# Patient Record
Sex: Male | Born: 1970 | Race: Black or African American | Hispanic: No | Marital: Single | Smoking: Never smoker
Health system: Southern US, Community
[De-identification: ages and names within clinical notes are randomized; demographics above are authoritative.]

## PROBLEM LIST (undated history)

## (undated) DIAGNOSIS — I10 Essential (primary) hypertension: Secondary | ICD-10-CM

## (undated) DIAGNOSIS — E119 Type 2 diabetes mellitus without complications: Secondary | ICD-10-CM

## (undated) DIAGNOSIS — G4730 Sleep apnea, unspecified: Secondary | ICD-10-CM

## (undated) HISTORY — PX: COLONOSCOPY: SHX174

## (undated) HISTORY — DX: Sleep apnea, unspecified: G47.30

---

## 2002-03-17 ENCOUNTER — Emergency Department (HOSPITAL_COMMUNITY): Admission: EM | Admit: 2002-03-17 | Discharge: 2002-03-17 | Payer: Self-pay | Admitting: Emergency Medicine

## 2002-04-02 ENCOUNTER — Emergency Department (HOSPITAL_COMMUNITY): Admission: EM | Admit: 2002-04-02 | Discharge: 2002-04-02 | Payer: Self-pay | Admitting: Emergency Medicine

## 2017-09-26 ENCOUNTER — Encounter (HOSPITAL_COMMUNITY): Payer: Self-pay | Admitting: Emergency Medicine

## 2017-09-26 ENCOUNTER — Inpatient Hospital Stay (HOSPITAL_COMMUNITY)
Admission: EM | Admit: 2017-09-26 | Discharge: 2017-09-29 | DRG: 638 | Disposition: A | Discharge status: Home / Self Care | Payer: Managed Care, Other (non HMO) | Attending: Internal Medicine | Admitting: Internal Medicine

## 2017-09-26 ENCOUNTER — Other Ambulatory Visit: Payer: Self-pay

## 2017-09-26 DIAGNOSIS — E860 Dehydration: Secondary | ICD-10-CM | POA: Diagnosis present

## 2017-09-26 DIAGNOSIS — E1110 Type 2 diabetes mellitus with ketoacidosis without coma: Secondary | ICD-10-CM | POA: Diagnosis not present

## 2017-09-26 DIAGNOSIS — I10 Essential (primary) hypertension: Secondary | ICD-10-CM | POA: Diagnosis present

## 2017-09-26 DIAGNOSIS — E119 Type 2 diabetes mellitus without complications: Secondary | ICD-10-CM

## 2017-09-26 DIAGNOSIS — Z6841 Body Mass Index (BMI) 40.0 and over, adult: Secondary | ICD-10-CM

## 2017-09-26 DIAGNOSIS — Z7984 Long term (current) use of oral hypoglycemic drugs: Secondary | ICD-10-CM

## 2017-09-26 DIAGNOSIS — E138 Other specified diabetes mellitus with unspecified complications: Secondary | ICD-10-CM

## 2017-09-26 DIAGNOSIS — E875 Hyperkalemia: Secondary | ICD-10-CM | POA: Diagnosis not present

## 2017-09-26 DIAGNOSIS — IMO0001 Reserved for inherently not codable concepts without codable children: Secondary | ICD-10-CM

## 2017-09-26 DIAGNOSIS — E878 Other disorders of electrolyte and fluid balance, not elsewhere classified: Secondary | ICD-10-CM | POA: Diagnosis present

## 2017-09-26 DIAGNOSIS — E871 Hypo-osmolality and hyponatremia: Secondary | ICD-10-CM | POA: Diagnosis present

## 2017-09-26 DIAGNOSIS — R739 Hyperglycemia, unspecified: Secondary | ICD-10-CM | POA: Diagnosis not present

## 2017-09-26 DIAGNOSIS — E876 Hypokalemia: Secondary | ICD-10-CM | POA: Diagnosis not present

## 2017-09-26 DIAGNOSIS — Z794 Long term (current) use of insulin: Secondary | ICD-10-CM

## 2017-09-26 HISTORY — DX: Type 2 diabetes mellitus without complications: E11.9

## 2017-09-26 LAB — I-STAT CHEM 8, ED
BUN: 30 mg/dL — AB (ref 6–20)
CHLORIDE: 92 mmol/L — AB (ref 101–111)
CREATININE: 1.1 mg/dL (ref 0.61–1.24)
Calcium, Ion: 1.09 mmol/L — ABNORMAL LOW (ref 1.15–1.40)
HCT: 50 % (ref 39.0–52.0)
Hemoglobin: 17 g/dL (ref 13.0–17.0)
POTASSIUM: 6 mmol/L — AB (ref 3.5–5.1)
Sodium: 124 mmol/L — ABNORMAL LOW (ref 135–145)
TCO2: 18 mmol/L — ABNORMAL LOW (ref 22–32)

## 2017-09-26 LAB — GLUCOSE, CAPILLARY
GLUCOSE-CAPILLARY: 319 mg/dL — AB (ref 65–99)
Glucose-Capillary: 459 mg/dL — ABNORMAL HIGH (ref 65–99)
Glucose-Capillary: 391 mg/dL — ABNORMAL HIGH (ref 65–99)
Glucose-Capillary: 527 mg/dL (ref 65–99)
Glucose-Capillary: 469 mg/dL — ABNORMAL HIGH (ref 65–99)
Glucose-Capillary: 466 mg/dL — ABNORMAL HIGH (ref 65–99)
Glucose-Capillary: 387 mg/dL — ABNORMAL HIGH (ref 65–99)
Glucose-Capillary: 364 mg/dL — ABNORMAL HIGH (ref 65–99)

## 2017-09-26 LAB — BLOOD GAS, VENOUS
Acid-base deficit: 10 mmol/L — ABNORMAL HIGH (ref 0.0–2.0)
BICARBONATE: 14.4 mmol/L — AB (ref 20.0–28.0)
O2 SAT: 94.1 %
PATIENT TEMPERATURE: 98.6
PCO2 VEN: 29 mmHg — AB (ref 44.0–60.0)
PO2 VEN: 78.6 mmHg — AB (ref 32.0–45.0)
pH, Ven: 7.318 (ref 7.250–7.430)

## 2017-09-26 LAB — CBC WITH DIFFERENTIAL/PLATELET
Basophils Absolute: 0 10*3/uL (ref 0.0–0.1)
Basophils Relative: 0 %
EOS ABS: 0 10*3/uL (ref 0.0–0.7)
EOS PCT: 0 %
HCT: 43.6 % (ref 39.0–52.0)
HEMOGLOBIN: 15.2 g/dL (ref 13.0–17.0)
LYMPHS PCT: 18 %
Lymphs Abs: 1.2 10*3/uL (ref 0.7–4.0)
MCH: 29.3 pg (ref 26.0–34.0)
MCHC: 34.9 g/dL (ref 30.0–36.0)
MCV: 84.2 fL (ref 78.0–100.0)
MONOS PCT: 12 %
Monocytes Absolute: 0.8 10*3/uL (ref 0.1–1.0)
Neutro Abs: 4.7 10*3/uL (ref 1.7–7.7)
Neutrophils Relative %: 70 %
PLATELETS: 352 10*3/uL (ref 150–400)
RBC: 5.18 MIL/uL (ref 4.22–5.81)
RDW: 13.5 % (ref 11.5–15.5)
WBC: 6.8 10*3/uL (ref 4.0–10.5)

## 2017-09-26 LAB — URINALYSIS, ROUTINE W REFLEX MICROSCOPIC
Bacteria, UA: NONE SEEN
Bilirubin Urine: NEGATIVE
Glucose, UA: >=500 mg/dL — AB
KETONES UR: 20 mg/dL — AB
Leukocytes, UA: NEGATIVE
Nitrite: NEGATIVE
PROTEIN: NEGATIVE mg/dL
Specific Gravity, Urine: 1.025 (ref 1.005–1.030)
pH: 5 (ref 5.0–8.0)

## 2017-09-26 LAB — BASIC METABOLIC PANEL
Anion gap: 12 (ref 5–15)
Anion gap: 11 (ref 5–15)
BUN: 19 mg/dL (ref 6–20)
BUN: 16 mg/dL (ref 6–20)
CALCIUM: 9.3 mg/dL (ref 8.9–10.3)
CO2: 20 mmol/L — ABNORMAL LOW (ref 22–32)
CO2: 21 mmol/L — ABNORMAL LOW (ref 22–32)
CREATININE: 1.22 mg/dL (ref 0.61–1.24)
Calcium: 9.5 mg/dL (ref 8.9–10.3)
Chloride: 99 mmol/L — ABNORMAL LOW (ref 101–111)
Chloride: 102 mmol/L (ref 101–111)
Creatinine, Ser: 1.1 mg/dL (ref 0.61–1.24)
GFR calc Af Amer: >60 mL/min (ref >60)
GFR calc Af Amer: >60 mL/min (ref >60)
GFR calc non Af Amer: >60 mL/min (ref >60)
GFR calc non Af Amer: >60 mL/min (ref >60)
GLUCOSE: 421 mg/dL — AB (ref 65–99)
Glucose, Bld: 406 mg/dL — ABNORMAL HIGH (ref 65–99)
Potassium: 3.9 mmol/L (ref 3.5–5.1)
Potassium: 3.7 mmol/L (ref 3.5–5.1)
SODIUM: 131 mmol/L — AB (ref 135–145)
Sodium: 134 mmol/L — ABNORMAL LOW (ref 135–145)

## 2017-09-26 LAB — CBG MONITORING, ED
GLUCOSE-CAPILLARY: 464 mg/dL — AB (ref 65–99)
Glucose-Capillary: >600 mg/dL (ref 65–99)
Glucose-Capillary: >600 mg/dL (ref 65–99)
Glucose-Capillary: >600 mg/dL (ref 65–99)

## 2017-09-26 LAB — MRSA PCR SCREENING: MRSA BY PCR: NEGATIVE

## 2017-09-26 MED ORDER — SODIUM CHLORIDE 0.9 % IV BOLUS (SEPSIS)
1000.0000 mL | Freq: Once | INTRAVENOUS | Status: AC
  Administered 2017-09-26: 1000 mL via INTRAVENOUS

## 2017-09-26 MED ORDER — SODIUM CHLORIDE 0.9 % IV SOLN
INTRAVENOUS | Status: DC
  Administered 2017-09-26: 11:00:00 via INTRAVENOUS

## 2017-09-26 MED ORDER — SODIUM CHLORIDE 0.9 % IV SOLN
INTRAVENOUS | Status: DC
  Administered 2017-09-26: 5.4 [IU]/h via INTRAVENOUS
  Administered 2017-09-26: 23.4 [IU]/h via INTRAVENOUS
  Filled 2017-09-26 (×4): qty 1

## 2017-09-26 MED ORDER — SODIUM CHLORIDE 0.9 % IV SOLN
INTRAVENOUS | Status: AC
  Administered 2017-09-26: 17:00:00 via INTRAVENOUS

## 2017-09-26 NOTE — Plan of Care (Signed)
  Respiratory: Ability to maintain adequate ventilation will improve 09/26/2017 1857 - Progressing by Lani Daltonarpenter, Cecil Bixby L, RN   Nutritional: Maintenance of adequate nutrition will improve 09/26/2017 1857 - Progressing by Dallin Daltonarpenter, Rochell Mabie L, RN   Urinary Elimination: Ability to achieve and maintain adequate renal perfusion and functioning will improve 09/26/2017 1857 - Progressing by Ramey Daltonarpenter, Tahari Clabaugh L, RN

## 2017-09-26 NOTE — ED Provider Notes (Signed)
[facility]-EMERGENCY DEPT Provider Note   CSN: [id number] Arrival date & time: 09/26/17  1023     History   Chief Complaint Chief Complaint  Patient presents with  . Hyperglycemia    HPI Chad Vega is a 47 y.o. male.  HPI Patient presents to the emergency room for evaluation of hyperglycemia.  Patient has a history of diabetes.  He takes metformin.  Patient does not have a glucose meter at home blood sugars.  He has noticed over the last few weeks that he has been fatigued and has had increased thirst and urination.  He has been on the same dose of metformin for a while.  He went to see his primary care doctor today and at the office his blood sugar was reading as high and could not be measured.  Patient was sent to the emergency room for further evaluation.  He denies any trouble with fevers, cough, vomiting, or diarrhea.  No significant changes in his diet.  No recent illnesses. Past Medical History:  Diagnosis Date  . Diabetes mellitus without complication (HCC)     There are no active problems to display for this patient.   History reviewed. No pertinent surgical history.     Home Medications    Prior to Admission medications   Medication Sig Start Date End Date Taking? Authorizing Provider  Cholecalciferol (VITAMIN D3) 5000 units CAPS Take 5,000 capsules by mouth daily. 09/15/17  Yes [provider]  esomeprazole (NEXIUM) 40 MG capsule Take 40 mg by mouth daily. 09/15/17  Yes [provider]  metFORMIN (GLUCOPHAGE) 500 MG tablet Take 500 mg by mouth 2 (two) times daily. 09/25/17  Yes [provider]  telmisartan-hydrochlorothiazide (MICARDIS HCT) 80-25 MG tablet Take 1 tablet by mouth daily. 09/10/17  Yes [provider]    Family History No family history on file.  Social History Social History   Tobacco Use  . Smoking status: Never Smoker  . Smokeless tobacco: Never Used  Substance Use Topics  .  Alcohol use: No    Frequency: Never  . Drug use: Not on file     Allergies   Patient has no known allergies.   Review of Systems Review of Systems  All other systems reviewed and are negative.    Physical Exam Updated Vital Signs BP 129/86 (BP Location: Right Arm)   Pulse 97   Temp 99.1 F (37.3 C) (Oral)   Resp 20   SpO2 98%   Physical Exam  Constitutional: He appears well-developed and well-nourished. No distress.  Overweight  HENT:  Head: Normocephalic and atraumatic.  Right Ear: External ear normal.  Left Ear: External ear normal.  Eyes: Conjunctivae are normal. Right eye exhibits no discharge. Left eye exhibits no discharge. No scleral icterus.  Neck: Neck supple. No tracheal deviation present.  Cardiovascular: Normal rate, regular rhythm and intact distal pulses.  Pulmonary/Chest: Effort normal and breath sounds normal. No stridor. No respiratory distress. He has no wheezes. He has no rales.  Abdominal: Soft. Bowel sounds are normal. He exhibits no distension. There is no tenderness. There is no rebound and no guarding.  Musculoskeletal: He exhibits no edema or tenderness.  Neurological: He is alert. He has normal strength. No cranial nerve deficit (no facial droop, extraocular movements intact, no slurred speech) or sensory deficit. He exhibits normal muscle tone. He displays no seizure activity. Coordination normal.  Skin: Skin is warm and dry. No rash noted.  Psychiatric: He has a normal  mood and affect.  Nursing note and vitals reviewed.    ED Treatments / Results  Labs (all labs ordered are listed, but only abnormal results are displayed) Labs Reviewed  BLOOD GAS, VENOUS - Abnormal; Notable for the following components:      Result Value   pCO2, Ven 29.0 (*)    pO2, Ven 78.6 (*)    Bicarbonate 14.4 (*)    Acid-base deficit 10.0 (*)    All other components within normal limits  URINALYSIS, ROUTINE W REFLEX MICROSCOPIC - Abnormal; Notable for the  following components:   Color, Urine COLORLESS (*)    Glucose, UA >=500 (*)    Hgb urine dipstick SMALL (*)    Ketones, ur 20 (*)    Squamous Epithelial / LPF 0-5 (*)    All other components within normal limits  CBG MONITORING, ED - Abnormal; Notable for the following components:   Glucose-Capillary >600 (*)    All other components within normal limits  I-STAT CHEM 8, ED - Abnormal; Notable for the following components:   Sodium 124 (*)    Potassium 6.0 (*)    Chloride 92 (*)    BUN 30 (*)    Glucose, Bld >700 (*)    Calcium, Ion 1.09 (*)    TCO2 18 (*)    All other components within normal limits  CBG MONITORING, ED - Abnormal; Notable for the following components:   Glucose-Capillary >600 (*)    All other components within normal limits  CBG MONITORING, ED - Abnormal; Notable for the following components:   Glucose-Capillary >600 (*)    All other components within normal limits  CBG MONITORING, ED - Abnormal; Notable for the following components:   Glucose-Capillary >600 (*)    All other components within normal limits  CBC WITH DIFFERENTIAL/PLATELET    Procedures .Critical Care Performed by: Linwood DibblesKnapp, Khylah Kendra, MD Authorized by: Linwood DibblesKnapp, Lillyth Spong, MD   Critical care provider statement:    Critical care time (minutes):  30   Critical care was necessary to treat or prevent imminent or life-threatening deterioration of the following conditions:  Endocrine crisis   Critical care was time spent personally by me on the following activities:  Discussions with consultants, evaluation of patient's response to treatment, examination of patient, ordering and performing treatments and interventions, ordering and review of laboratory studies, ordering and review of radiographic studies, pulse oximetry, re-evaluation of patient's condition, obtaining history from patient or surrogate and review of old charts   (including critical care time)  Medications Ordered in ED Medications  sodium chloride  0.9 % bolus 1,000 mL (0 mLs Intravenous Stopped 09/26/17 1127)    And  0.9 %  sodium chloride infusion ( Intravenous New Bag/Given 09/26/17 1126)  insulin regular (NOVOLIN R,HUMULIN R) 100 Units in sodium chloride 0.9 % 100 mL (1 Units/mL) infusion (16.2 Units/hr Intravenous Rate/Dose Change 09/26/17 1350)  sodium chloride 0.9 % bolus 1,000 mL (not administered)     Initial Impression / Assessment and Plan / ED Course  I have reviewed the triage vital signs and the nursing notes.  Pertinent labs & imaging results that were available during my care of the patient were reviewed by me and considered in my medical decision making (see chart for details).  Clinical Course as of Sep 26 1417  Tue Sep 26, 2017  1045 Blood sugar was reading greater than 600.  I will start IV fluids and insulin.  Sx seem to suggest hyperglycemia without DKA  [JK]  Clinical Course User Index [JK] Linwood Dibbles, MD    Patient presented from the primary care doctor's office for evaluation of hyperglycemia.  Patient is having fatigue, polyuria and polydipsia but no nausea vomiting or other complaints.  His laboratory tests are notable for severe hyperglycemia with glucose measuring greater than 700.  Patient also have evidence of hyponatremia as well as hyper kalemia and hypochloremia.  Patient's bicarb is also decreased at 18.  He is not acidotic but he does have decreased bicarbonate and has borderline labs for DKA (gap is borderline increased at 14).  Considering his severe persistent hyperglycemia I  think it is reasonable to bring him into the hospital for further treatment of his elevated blood sugar.  Final Clinical Impressions(s) / ED Diagnoses   Final diagnoses:  Hyperglycemia  Hyponatremia  Hypochloremia  Hyperkalemia      Linwood Dibbles, MD 09/26/17 1419

## 2017-09-26 NOTE — H&P (Signed)
History and Physical    Estevan Kersh ZOX:[id number] DOB: [birth year] DOA: 09/26/2017  PCP: Norm Salt, PA  Patient coming from: Home  Chief Complaint: High sugar  HPI: Tremel Setters is a 47 y.o. male with medical history significant of non-insulin-dependent diabetes and hypertension comes in from PCP office for uncontrolled diabetes.  Patient has been on metformin 500 mg p.o. twice a day and he states he has been compliant with this.  His PCP increase it today however noted his sugar to be over 400 in the office was sent to the ED.  Patient sugars over 700 here initially.  He is been very tired lately.  He has not been running any fevers.  He denies any nausea vomiting or diarrhea.  He appears very well.  Patient currently on insulin drip and his sugar down to about 450 now.  Patient has been referred for admission for hyperosmolar nonketotic state.  Review of Systems: As per HPI otherwise 10 point review of systems negative.   Past Medical History:  Diagnosis Date  . Diabetes mellitus without complication (HCC)     History reviewed. No pertinent surgical history.   reports that  has never smoked. he has never used smokeless tobacco. He reports that he does not drink alcohol. His drug history is not on file.  No Known Allergies  No family history on file.  No premature coronary artery disease  Prior to Admission medications   Medication Sig Start Date End Date Taking? Authorizing Provider  Cholecalciferol (VITAMIN D3) 5000 units CAPS Take 5,000 capsules by mouth daily. 09/15/17  Yes [provider]  esomeprazole (NEXIUM) 40 MG capsule Take 40 mg by mouth daily. 09/15/17  Yes [provider]  metFORMIN (GLUCOPHAGE) 500 MG tablet Take 500 mg by mouth 2 (two) times daily. 09/25/17  Yes [provider]  telmisartan-hydrochlorothiazide (MICARDIS HCT) 80-25 MG tablet Take 1 tablet by mouth daily. 09/10/17  Yes [provider]    Physical  Exam: Vitals:   09/26/17 1035 09/26/17 1242 09/26/17 1508  BP: 128/78 129/86 120/90  Pulse: (!) 110 97 (!) 117  Resp: 20 20 20   Temp: 99.1 F (37.3 C)  98.9 F (37.2 C)  TempSrc: Oral  Oral  SpO2: 95% 98% 94%      Constitutional: NAD, calm, comfortable Vitals:   09/26/17 1035 09/26/17 1242 09/26/17 1508  BP: 128/78 129/86 120/90  Pulse: (!) 110 97 (!) 117  Resp: 20 20 20   Temp: 99.1 F (37.3 C)  98.9 F (37.2 C)  TempSrc: Oral  Oral  SpO2: 95% 98% 94%   Eyes: PERRL, lids and conjunctivae normal ENMT: Mucous membranes are moist. Posterior pharynx clear of any exudate or lesions.Normal dentition.  Neck: normal, supple, no masses, no thyromegaly Respiratory: clear to auscultation bilaterally, no wheezing, no crackles. Normal respiratory effort. No accessory muscle use.  Cardiovascular: Regular rate and rhythm, no murmurs / rubs / gallops. No extremity edema. 2+ pedal pulses. No carotid bruits.  Abdomen: no tenderness, no masses palpated. No hepatosplenomegaly. Bowel sounds positive.  Musculoskeletal: no clubbing / cyanosis. No joint deformity upper and lower extremities. Good ROM, no contractures. Normal muscle tone.  Skin: no rashes, lesions, ulcers. No induration Neurologic: CN 2-12 grossly intact. Sensation intact, DTR normal. Strength 5/5 in all 4.  Psychiatric: Normal judgment and insight. Alert and oriented x 3. Normal mood.    Labs on Admission: I have personally reviewed following labs and imaging studies  CBC: Recent Labs  Lab 09/26/17  1043 09/26/17 1056  WBC 6.8  --   NEUTROABS 4.7  --   HGB 15.2 17.0  HCT 43.6 50.0  MCV 84.2  --   PLT 352  --    Basic Metabolic Panel: Recent Labs  Lab 09/26/17 1056  NA 124*  K 6.0*  CL 92*  GLUCOSE >700*  BUN 30*  CREATININE 1.10   GFR: CrCl cannot be calculated (Unknown ideal weight.). Liver Function Tests: No results for input(s): AST, ALT, ALKPHOS, BILITOT, PROT, ALBUMIN in the last 168 hours. No results  for input(s): LIPASE, AMYLASE in the last 168 hours. No results for input(s): AMMONIA in the last 168 hours. Coagulation Profile: No results for input(s): INR, PROTIME in the last 168 hours. Cardiac Enzymes: No results for input(s): CKTOTAL, CKMB, CKMBINDEX, TROPONINI in the last 168 hours. BNP (last 3 results) No results for input(s): PROBNP in the last 8760 hours. HbA1C: No results for input(s): HGBA1C in the last 72 hours. CBG: Recent Labs  Lab 09/26/17 1037 09/26/17 1139 09/26/17 1240 09/26/17 1348 09/26/17 1507  GLUCAP >600* >600* >600* >600* 464*   Lipid Profile: No results for input(s): CHOL, HDL, LDLCALC, TRIG, CHOLHDL, LDLDIRECT in the last 72 hours. Thyroid Function Tests: No results for input(s): TSH, T4TOTAL, FREET4, T3FREE, THYROIDAB in the last 72 hours. Anemia Panel: No results for input(s): VITAMINB12, FOLATE, FERRITIN, TIBC, IRON, RETICCTPCT in the last 72 hours. Urine analysis:    Component Value Date/Time   COLORURINE COLORLESS (A) 09/26/2017 1043   APPEARANCEUR CLEAR 09/26/2017 1043   LABSPEC 1.025 09/26/2017 1043   PHURINE 5.0 09/26/2017 1043   GLUCOSEU >=500 (A) 09/26/2017 1043   HGBUR SMALL (A) 09/26/2017 1043   BILIRUBINUR NEGATIVE 09/26/2017 1043   KETONESUR 20 (A) 09/26/2017 1043   PROTEINUR NEGATIVE 09/26/2017 1043   NITRITE NEGATIVE 09/26/2017 1043   LEUKOCYTESUR NEGATIVE 09/26/2017 1043   Sepsis Labs: !!!!!!!!!!!!!!!!!!!!!!!!!!!!!!!!!!!!!!!!!!!! @LABRCNTIP (procalcitonin:4,lacticidven:4) )No results found for this or any previous visit (from the past 240 hour(s)).   Radiological Exams on Admission: No results found.  Case discussed with Dr. Lynelle DoctorKnapp in the ED Chart fully reviewed  Assessment/Plan 47 year old male with a history of diabetes comes in with hyperosmolar nonketotic state  Principal Problem:   Diabetes (HCC)-BMP is repeating right now.  Patient not acidotic.  Gap is around 13.  Continue insulin drip.  Check hourly glucose.   Continue normal saline IV fluids.  Glucose should continue to come down over the next couple hours and can likely be transitioned off insulin drip later this evening.  Active Problems:   Essential hypertension-continue home medication  Hyperkalemia-given IV fluids and insulin.  Will repeat a BMP now after over a liter fluid in the ED.  Initial potassium was 6.     DVT prophylaxis: SCDs Code Status: Full Family Communication: None Disposition Plan: Per day team Consults called: None Admission status: Observation   Mikell Kazlauskas A MD [facility]  If 7PM-7AM, please contact night-coverage [email or web address] Password [email or web address]  09/26/2017, 3:55 PM

## 2017-09-26 NOTE — ED Notes (Signed)
Bed: WA15 Expected date:  Expected time:  Means of arrival:  Comments: EMS/hyperglycemia 

## 2017-09-26 NOTE — ED Triage Notes (Signed)
Transported by [facility] from [facility] office--pt was being seen for a routine visit and CBG read "HIGH." Pt reported to EMS that he did not take his Metformin this morning. EMS obtained a CBG of 544 mg/dl and administered approximately 250 cc of NS PTA.

## 2017-09-26 NOTE — ED Notes (Signed)
ED Provider at bedside. 

## 2017-09-27 DIAGNOSIS — E119 Type 2 diabetes mellitus without complications: Secondary | ICD-10-CM | POA: Diagnosis not present

## 2017-09-27 DIAGNOSIS — E871 Hypo-osmolality and hyponatremia: Secondary | ICD-10-CM | POA: Diagnosis not present

## 2017-09-27 DIAGNOSIS — E875 Hyperkalemia: Secondary | ICD-10-CM

## 2017-09-27 DIAGNOSIS — Z794 Long term (current) use of insulin: Secondary | ICD-10-CM

## 2017-09-27 DIAGNOSIS — E876 Hypokalemia: Secondary | ICD-10-CM

## 2017-09-27 LAB — BASIC METABOLIC PANEL
Anion gap: 10 (ref 5–15)
Anion gap: 8 (ref 5–15)
Anion gap: 9 (ref 5–15)
BUN: 13 mg/dL (ref 6–20)
BUN: 13 mg/dL (ref 6–20)
BUN: 15 mg/dL (ref 6–20)
CO2: 22 mmol/L (ref 22–32)
CO2: 22 mmol/L (ref 22–32)
CO2: 23 mmol/L (ref 22–32)
Calcium: 8.8 mg/dL — ABNORMAL LOW (ref 8.9–10.3)
Calcium: 8.9 mg/dL (ref 8.9–10.3)
Calcium: 9.2 mg/dL (ref 8.9–10.3)
Chloride: 101 mmol/L (ref 101–111)
Chloride: 101 mmol/L (ref 101–111)
Chloride: 102 mmol/L (ref 101–111)
Creatinine, Ser: 0.95 mg/dL (ref 0.61–1.24)
Creatinine, Ser: 1.04 mg/dL (ref 0.61–1.24)
Creatinine, Ser: 1.14 mg/dL (ref 0.61–1.24)
GFR calc Af Amer: >60 mL/min (ref >60)
GFR calc Af Amer: >60 mL/min (ref >60)
GFR calc Af Amer: >60 mL/min (ref >60)
GFR calc non Af Amer: >60 mL/min (ref >60)
GFR calc non Af Amer: >60 mL/min (ref >60)
GFR calc non Af Amer: >60 mL/min (ref >60)
Glucose, Bld: 198 mg/dL — ABNORMAL HIGH (ref 65–99)
Glucose, Bld: 293 mg/dL — ABNORMAL HIGH (ref 65–99)
Glucose, Bld: 456 mg/dL — ABNORMAL HIGH (ref 65–99)
Potassium: 3 mmol/L — ABNORMAL LOW (ref 3.5–5.1)
Potassium: 3.3 mmol/L — ABNORMAL LOW (ref 3.5–5.1)
Potassium: 3.9 mmol/L (ref 3.5–5.1)
Sodium: 131 mmol/L — ABNORMAL LOW (ref 135–145)
Sodium: 133 mmol/L — ABNORMAL LOW (ref 135–145)
Sodium: 134 mmol/L — ABNORMAL LOW (ref 135–145)

## 2017-09-27 LAB — GLUCOSE, CAPILLARY
GLUCOSE-CAPILLARY: 174 mg/dL — AB (ref 65–99)
GLUCOSE-CAPILLARY: 193 mg/dL — AB (ref 65–99)
GLUCOSE-CAPILLARY: 202 mg/dL — AB (ref 65–99)
GLUCOSE-CAPILLARY: 266 mg/dL — AB (ref 65–99)
GLUCOSE-CAPILLARY: 296 mg/dL — AB (ref 65–99)
GLUCOSE-CAPILLARY: 334 mg/dL — AB (ref 65–99)
GLUCOSE-CAPILLARY: 435 mg/dL — AB (ref 65–99)
Glucose-Capillary: 299 mg/dL — ABNORMAL HIGH (ref 65–99)
Glucose-Capillary: 235 mg/dL — ABNORMAL HIGH (ref 65–99)
Glucose-Capillary: 176 mg/dL — ABNORMAL HIGH (ref 65–99)
Glucose-Capillary: 169 mg/dL — ABNORMAL HIGH (ref 65–99)
Glucose-Capillary: 329 mg/dL — ABNORMAL HIGH (ref 65–99)
Glucose-Capillary: 431 mg/dL — ABNORMAL HIGH (ref 65–99)

## 2017-09-27 LAB — CBC
HEMATOCRIT: 41.6 % (ref 39.0–52.0)
Hemoglobin: 15 g/dL (ref 13.0–17.0)
MCH: 29.5 pg (ref 26.0–34.0)
MCHC: 36.1 g/dL — AB (ref 30.0–36.0)
MCV: 81.9 fL (ref 78.0–100.0)
PLATELETS: 269 10*3/uL (ref 150–400)
RBC: 5.08 MIL/uL (ref 4.22–5.81)
RDW: 13.3 % (ref 11.5–15.5)
WBC: 7.3 10*3/uL (ref 4.0–10.5)

## 2017-09-27 LAB — HEMOGLOBIN A1C
Hgb A1c MFr Bld: 13.3 % — ABNORMAL HIGH (ref 4.8–5.6)
MEAN PLASMA GLUCOSE: 335.01 mg/dL

## 2017-09-27 LAB — HIV ANTIBODY (ROUTINE TESTING W REFLEX): HIV Screen 4th Generation wRfx: NONREACTIVE

## 2017-09-27 MED ORDER — SODIUM CHLORIDE 0.9 % IV SOLN
INTRAVENOUS | Status: AC
  Administered 2017-09-27 – 2017-09-28 (×2): via INTRAVENOUS

## 2017-09-27 MED ORDER — INSULIN GLARGINE 100 UNIT/ML ~~LOC~~ SOLN
10.0000 [IU] | Freq: Every day | SUBCUTANEOUS | Status: DC
  Administered 2017-09-27: 10 [IU] via SUBCUTANEOUS
  Filled 2017-09-27: qty 0.1

## 2017-09-27 MED ORDER — METFORMIN HCL 500 MG PO TABS: 1000.0000 mg | ORAL_TABLET | Freq: Two times a day (BID) | ORAL | 1 refills | Status: AC

## 2017-09-27 MED ORDER — INSULIN ASPART 100 UNIT/ML ~~LOC~~ SOLN
0.0000 [IU] | Freq: Every day | SUBCUTANEOUS | Status: DC
Start: 2017-09-27 — End: 2017-09-29

## 2017-09-27 MED ORDER — POTASSIUM CHLORIDE CRYS ER 20 MEQ PO TBCR
40.0000 meq | EXTENDED_RELEASE_TABLET | Freq: Once | ORAL | Status: AC
  Administered 2017-09-27: 40 meq via ORAL
  Filled 2017-09-27: qty 2

## 2017-09-27 MED ORDER — INSULIN ASPART 100 UNIT/ML ~~LOC~~ SOLN
0.0000 [IU] | Freq: Three times a day (TID) | SUBCUTANEOUS | Status: DC
  Administered 2017-09-28 – 2017-09-29 (×4): 20 [IU] via SUBCUTANEOUS
  Administered 2017-09-29: 15 [IU] via SUBCUTANEOUS

## 2017-09-27 MED ORDER — INSULIN GLARGINE 100 UNIT/ML ~~LOC~~ SOLN
20.0000 [IU] | Freq: Every day | SUBCUTANEOUS | Status: DC
  Filled 2017-09-27: qty 0.2

## 2017-09-27 MED ORDER — IRBESARTAN 75 MG PO TABS
75.0000 mg | ORAL_TABLET | Freq: Every day | ORAL | Status: DC
  Administered 2017-09-27 – 2017-09-29 (×3): 75 mg via ORAL
  Filled 2017-09-27 (×4): qty 1

## 2017-09-27 MED ORDER — INSULIN STARTER KIT- PEN NEEDLES (ENGLISH)
1.0000 | Freq: Once | Status: AC
  Administered 2017-09-27: 1
  Filled 2017-09-27: qty 1

## 2017-09-27 MED ORDER — INSULIN ASPART 100 UNIT/ML ~~LOC~~ SOLN
5.0000 [IU] | Freq: Three times a day (TID) | SUBCUTANEOUS | Status: DC
  Administered 2017-09-28: 5 [IU] via SUBCUTANEOUS

## 2017-09-27 MED ORDER — LIVING WELL WITH DIABETES BOOK
Freq: Once | Status: AC
  Administered 2017-09-27: 12:00:00
  Filled 2017-09-27: qty 1

## 2017-09-27 MED ORDER — INSULIN ASPART 100 UNIT/ML ~~LOC~~ SOLN
0.0000 [IU] | Freq: Three times a day (TID) | SUBCUTANEOUS | Status: DC
  Administered 2017-09-27: 15 [IU] via SUBCUTANEOUS
  Administered 2017-09-27: 20 [IU] via SUBCUTANEOUS
  Administered 2017-09-27: 15 [IU] via SUBCUTANEOUS

## 2017-09-27 MED ORDER — INSULIN ASPART 100 UNIT/ML ~~LOC~~ SOLN
10.0000 [IU] | Freq: Once | SUBCUTANEOUS | Status: AC
  Administered 2017-09-27: 10 [IU] via SUBCUTANEOUS

## 2017-09-27 NOTE — Progress Notes (Signed)
Pt's CBG this am has been in the 170s now twice.  I called Marcelle OverlieKathryn Schorr,NP regarding transition orders off of the insulin gtt.  Samara DeistKathryn placed orders.  She advised to give the 10 units of Lantus Insulin SQ, then keep patient on IV Insulin for another hour after that and then to give the regular Insulin per sliding scale based on the current CBG.  Jewel BaizeHans C Ekam Bonebrake,RN,BSN,CCRN

## 2017-09-27 NOTE — Progress Notes (Signed)
PROGRESS NOTE  Chad Vega [id number] DOB: 06/21/1971 DOA: 09/26/2017 PCP: Norm SaltVanstory, Ashley N, PA  HPI/Recap of past 24 hours:  Blood sugar remain significantly elevated but improving  Assessment/Plan: Principal Problem:   Diabetes (HCC) Active Problems:   Essential hypertension  Severe hyperglycemia/borderline dka on presentation: -Blood glucose>700 on presentation, Gap at 14, urine +ketone -He is admitted to icu/stepdown on insulin drip/ivf - transitioned off insulin drip,  blood sugar still significantly elevated. Increase lantus, add meal coverage, continue ssi with night time coverage. -a1c 13.3, will  need to d/c on insulin, diabetes RN and dietician consult.  Electrolytes abnormalities: Hyponatremia due to combination of dehydration and pseudohyponatremia, correct blood glucose and continue ivf He presented with hyperkalemia, k 6 , likely due to insulin deficiency, now hypokalemia, replace. Check mag.   HTN: continue telmisartan, hold hctz.  Morbid obesity: Body mass index is 45.91 kg/m. Encourage weight loss   Code Status: full  Family Communication: patient   Disposition Plan: transfer to med surg   Consultants:  none  Procedures:  none  Antibiotics:  none   Objective: BP (!) 150/93   Pulse 97   Temp 98.1 F (36.7 C) (Oral)   Resp (!) 28   Ht 6\' 2"  (1.88 m)   Wt (!) 162.2 kg (357 lb 9.4 oz)   SpO2 97%   BMI 45.91 kg/m   Intake/Output Summary (Last 24 hours) at 09/27/2017 1431 Last data filed at 09/27/2017 1300 Gross per 24 hour  Intake 1707.15 ml  Output 1700 ml  Net 7.15 ml   Filed Weights   09/26/17 1600 09/26/17 1925  Weight: (!) 162.2 kg (357 lb 9.4 oz) (!) 162.2 kg (357 lb 9.4 oz)    Exam: Patient is examined daily including today on 09/27/2017, exams remain the same as of yesterday except that has changed    General:  NAD, obese  Cardiovascular: RRR  Respiratory: CTABL  Abdomen: Soft/ND/NT, positive  BS  Musculoskeletal: No Edema  Neuro: alert, oriented   Data Reviewed: Basic Metabolic Panel: Recent Labs  Lab 09/26/17 1056 09/26/17 1613 09/26/17 2017 09/26/17 2334 09/27/17 0405  NA 124* 131* 134* 133* 134*  K 6.0* 3.9 3.7 3.3* 3.0*  CL 92* 99* 102 102 101  CO2  --  20* 21* 22 23  GLUCOSE >700* 421* 406* 293* 198*  BUN 30* 19 16 15 13   CREATININE 1.10 1.22 1.10 1.04 0.95  CALCIUM  --  9.3 9.5 9.2 8.9   Liver Function Tests: No results for input(s): AST, ALT, ALKPHOS, BILITOT, PROT, ALBUMIN in the last 168 hours. No results for input(s): LIPASE, AMYLASE in the last 168 hours. No results for input(s): AMMONIA in the last 168 hours. CBC: Recent Labs  Lab 09/26/17 1043 09/26/17 1056 09/27/17 0405  WBC 6.8  --  7.3  NEUTROABS 4.7  --   --   HGB 15.2 17.0 15.0  HCT 43.6 50.0 41.6  MCV 84.2  --  81.9  PLT 352  --  269   Cardiac Enzymes:   No results for input(s): CKTOTAL, CKMB, CKMBINDEX, TROPONINI in the last 168 hours. BNP (last 3 results) No results for input(s): BNP in the last 8760 hours.  ProBNP (last 3 results) No results for input(s): PROBNP in the last 8760 hours.  CBG: Recent Labs  Lab 09/27/17 0559 09/27/17 0700 09/27/17 0757 09/27/17 0933 09/27/17 1114  GLUCAP 176* 169* 193* 334* 329*    Recent Results (from the past 240 hour(s))  MRSA PCR  Screening     Status: None   Collection Time: 09/26/17  5:04 PM  Result Value Ref Range Status   MRSA by PCR NEGATIVE NEGATIVE Final    Comment:        The GeneXpert MRSA Assay (FDA approved for NASAL specimens only), is one component of a comprehensive MRSA colonization surveillance program. It is not intended to diagnose MRSA infection nor to guide or monitor treatment for MRSA infections. Performed at [facility], [address] [place], [place] [address]      Studies: No results found.  Scheduled Meds: . insulin aspart  0-20 Units Subcutaneous TID WC  . insulin  glargine  10 Units Subcutaneous Daily  . irbesartan  75 mg Oral Daily    Continuous Infusions:   Time spent: 35 mins I have personally reviewed and interpreted on  09/27/2017 daily labs, tele strips, imagings as discussed above under date review session and assessment and plans.  I reviewed all nursing notes, ED notes,  vitals, pertinent old records  I have discussed plan of care as described above with RN , patient  on 09/27/2017   Albertine Grates MD, PhD  [facility] Pager [phone]. If 7PM-7AM, please contact night-coverage at [email or web address], password [facility] 09/27/2017, 2:31 PM  LOS: 0 days

## 2017-09-27 NOTE — Discharge Summary (Signed)
Discharge Summary  Anthem Frazer PHX:[id number] DOB: 1971-07-28  PCP: [name], PA  Admit date: 09/26/2017 Discharge date: 09/29/2017  Time spent: >59mns, more than 50% time spent on coordination of care, disease /medication education. Talk to community pharmacy about choice of insulin the patient insurance covers and patient is able to afford.   Current insurance dose not cover lantus/novolog Does cover levemir (30$per month)/humalog (25$ per month)  Recommendations for Outpatient Follow-up:  1. F/u with PMD within a week  for hospital discharge follow up, repeat cbc/bmp at follow up  Discharge Diagnoses:  Active Hospital Problems   Diagnosis Date Noted  . Diabetes ([facility] 09/26/2017  . Insulin dependent diabetes mellitus ([facility] 09/28/2017  . Essential hypertension 09/26/2017    Resolved Hospital Problems  No resolved problems to display.    Discharge Condition: stable  Diet recommendation: heart healthy/carb modified  Filed Weights   09/26/17 1600 09/26/17 1925  Weight: (!) 162.2 kg (357 lb 9.4 oz) (!) 162.2 kg (357 lb 9.4 oz)    History of present illness: (per admitting MD Dr DShanon Brow PCP: [name] PA  Patient coming from: Home  Chief Complaint: High sugar  HPI: [name] a 47y.o. male with medical history significant of non-insulin-dependent diabetes and hypertension comes in from PCP office for uncontrolled diabetes.  Patient has been on metformin 500 mg p.o. twice a day and he states he has been compliant with this.  His PCP increase it today however noted his sugar to be over 400 in the office was sent to the ED.  Patient sugars over 700 here initially.  He is been very tired lately.  He has not been running any fevers.  He denies any nausea vomiting or diarrhea.  He appears very well.  Patient currently on insulin drip and his sugar down to about 450 now.  Patient has been referred for admission for hyperosmolar nonketotic  state.    Hospital Course:  Principal Problem:   Diabetes ([facility] Active Problems:   Essential hypertension   Insulin dependent diabetes mellitus (HEdna   Severe hyperglycemia/borderline DKA on presentation: -Blood glucose>700 on presentation, Gap at 14, urine +ketone on presentation -He is admitted to icu/stepdown on insulin drip/ivf - transitioned off insulin drip -blood sugar significantly elevated required Increase lantus, increase meal coverage -a1c 13.3 -he is discharged on levemir 35units bid, humalog 10unit tid meal coverage , humalog ssi. Glucometer, testing supplies prescribed. -diabetes RN and dietician consulted. Input appreciated, patient is informed to continue outpatient diabetes education and disease management  Electrolytes abnormalities: Hyponatremia due to combination of dehydration and pseudohyponatremia, improved. He presented with hyperkalemia, k 6 on presentation , likely due to insulin deficiency,  now hypokalemia, replace. Mag 2.   HTN: hctz held in the hospital , restarted at discharge.  he is continued on telmisartan.  Morbid obesity: Body mass index is 45.91 kg/m. Encourage weight loss Need to reduce insulin dose if he start to loose weight, patient expressed understanding.   Code Status: full  Family Communication: patient   Disposition Plan: home   Consultants:  none  Procedures:  none  Antibiotics:  none   Discharge Exam: BP (!) 155/79 (BP Location: Left Arm)   Pulse 100   Temp 98.6 F (37 C) (Oral)   Resp 20   Ht 6' 2"  (1.88 m)   Wt (!) 162.2 kg (357 lb 9.4 oz)   SpO2 100%   BMI 45.91 kg/m    General:  NAD, obese  Cardiovascular: RRR  Respiratory: CTABL  Abdomen: Soft/ND/NT, positive BS  Musculoskeletal: No Edema  Neuro: alert, oriented    Discharge Instructions You were cared for by a hospitalist during your hospital stay. If you have any questions about your discharge medications or the  care you received while you were in the hospital after you are discharged, you can call the unit and asked to speak with the hospitalist on call if the hospitalist that took care of you is not available. Once you are discharged, your primary care physician will handle any further medical issues. Please note that NO REFILLS for any discharge medications will be authorized once you are discharged, as it is imperative that you return to your primary care physician (or establish a relationship with a primary care physician if you do not have one) for your aftercare needs so that they can reassess your need for medications and monitor your lab values.  Discharge Instructions    Ambulatory referral to Nutrition and Diabetic Education   Complete by:  As directed    Admit with glucose >700 mg/dl.  History of Type 2 DM taking Metformin.  PCP: [name], PA.   Diet - low sodium heart healthy   Complete by:  As directed    Carb modified diet   Increase activity slowly   Complete by:  As directed      Allergies as of 09/29/2017   No Known Allergies     Medication List    STOP taking these medications   Vitamin D3 5000 units Caps     TAKE these medications   blood glucose meter kit and supplies Kit Dispense based on patient and insurance preference. Use up to four times daily as directed. (FOR ICD-9 250.00, 250.01).   esomeprazole 40 MG capsule Commonly known as:  NEXIUM Take 40 mg by mouth daily.   Insulin Detemir 100 UNIT/ML Pen Commonly known as:  LEVEMIR FLEXTOUCH Inject 35 Units into the skin 2 (two) times daily.   insulin lispro 100 UNIT/ML KiwkPen Commonly known as:  HUMALOG KWIKPEN Inject 0.1 mLs (10 Units total) into the skin 3 (three) times daily. With meal   insulin lispro 100 UNIT/ML KiwkPen Commonly known as:  HUMALOG KWIKPEN Before each meal 3 times a day, 140-199 - 2 units, 200-250 - 4 units, 251-299 - 6 units,  300-349 - 8 units,  350 or above 10 units. Insulin PEN if  approved, provide syringes and needles if needed.   metFORMIN 500 MG tablet Commonly known as:  GLUCOPHAGE Take 2 tablets (1,000 mg total) by mouth 2 (two) times daily. What changed:  how much to take   telmisartan-hydrochlorothiazide 80-25 MG tablet Commonly known as:  MICARDIS HCT Take 1 tablet by mouth daily.      No Known Allergies Follow-up Information    [name], PA Follow up on 10/03/2017.   Specialty:  Physician Assistant Why:  hospital discharge follow up, diabetes control. Contact information: [place] [address] [phone]            The results of significant diagnostics from this hospitalization (including imaging, microbiology, ancillary and laboratory) are listed below for reference.    Significant Diagnostic Studies: No results found.  Microbiology: Recent Results (from the past 240 hour(s))  MRSA PCR Screening     Status: None   Collection Time: 09/26/17  5:04 PM  Result Value Ref Range Status   MRSA by PCR NEGATIVE NEGATIVE Final  Comment:        The GeneXpert MRSA Assay (FDA approved for NASAL specimens only), is one component of a comprehensive MRSA colonization surveillance program. It is not intended to diagnose MRSA infection nor to guide or monitor treatment for MRSA infections. Performed at [facility], [place] [address] [place], [place] [address]      Labs: Basic Metabolic Panel: Recent Labs  Lab 09/26/17 2334 09/27/17 0405 09/27/17 2122 09/28/17 0413 09/28/17 0832 09/29/17 0335  NA 133* 134* 131* 131*  --  131*  K 3.3* 3.0* 3.9 3.7  --  3.5  CL 102 101 101 100*  --  101  CO2 22 23 22 22   --  22  GLUCOSE 293* 198* 456* 393* 505* 326*  BUN 15 13 13 10   --  10  CREATININE 1.04 0.95 1.14 1.01  --  0.86  CALCIUM 9.2 8.9 8.8* 8.3*  --  8.5*  MG  --   --   --  2.0  --   --    Liver Function Tests: Recent Labs  Lab 09/28/17 0413  AST 40  ALT 44  ALKPHOS 103  BILITOT  0.8  PROT 6.4*  ALBUMIN 3.6   No results for input(s): LIPASE, AMYLASE in the last 168 hours. No results for input(s): AMMONIA in the last 168 hours. CBC: Recent Labs  Lab 09/26/17 1043 09/26/17 1056 09/27/17 0405 09/28/17 0413  WBC 6.8  --  7.3 6.0  NEUTROABS 4.7  --   --   --   HGB 15.2 17.0 15.0 13.7  HCT 43.6 50.0 41.6 41.1  MCV 84.2  --  81.9 84.0  PLT 352  --  269 268   Cardiac Enzymes: No results for input(s): CKTOTAL, CKMB, CKMBINDEX, TROPONINI in the last 168 hours. BNP: BNP (last 3 results) No results for input(s): BNP in the last 8760 hours.  ProBNP (last 3 results) No results for input(s): PROBNP in the last 8760 hours.  CBG: Recent Labs  Lab 09/28/17 1136 09/28/17 1714 09/28/17 2109 09/29/17 0734 09/29/17 1206  GLUCAP 428* 430* 427* 372* 384*       Signed:  Florencia Reasons MD, PhD  [facility] 09/29/2017, 3:39 PM

## 2017-09-27 NOTE — Progress Notes (Signed)
Inpatient Diabetes Program Recommendations  AACE/ADA: New Consensus Statement on Inpatient Glycemic Control (2015)  Target Ranges:  Prepandial:   less than 140 mg/dL      Peak postprandial:   less than 180 mg/dL (1-2 hours)      Critically ill patients:  140 - 180 mg/dL   Results for Chad Vega, [name] (MRN [id number]) as of 09/27/2017 08:02  Ref. Range 09/26/2017 10:56  Glucose Latest Ref Range: 65 - 99 mg/dL >409>700 (HH)    Admit with: Hyperglycemia from PCP office  History: DM2  Home DM Meds: Metformin 500 mg BID  Current Insulin Orders: Lantus 10 units daily      Novolog Resistant Correction Scale/ SSI (0-20 units) TID AC       Transitioned off the IV insulin drip this AM (Lantus given at 7:30am today).  Current Hemoglobin A1c pending.  Outpatient Diabetes Education [facility].  Plan to speak with pt prior to discharge today.     --Will follow patient during hospitalization--  Ambrose FinlandJeannine Johnston Tyesha Joffe RN, MSN, CDE Diabetes Coordinator Inpatient Glycemic Control Team Team Pager: [phone] (8a-5p)

## 2017-09-27 NOTE — Progress Notes (Signed)
MD- Since glucose >700 mg/dl on admit, pt likely needs insulin for d/c (at least temporarily).  Would prefer insulin pens.  Needs CBG meter for home.  Recommend increasing home Metformin to 1000 mg BID.  He may need Lantus and Novolog for home (at least short-term) until increased Metformin doses reach therapeutic levels.   Outpatient Diabetes Education referral placed for patient to the [facility] this AM.  Please give pt the following Rxs at time of d/c:  1. Lantus pen- Order # [id number]  2. Novolog pen (if needed)- Order # [id number]  7. Insulin Pen needles- Order # [id number]  7. CBG meter kit- Order # [id number]   Met with pt this AM to discuss admission glucose >700 mg/dl.  Pt told me he went to his PCP's office yesterday with symptoms of thirst and excessive urination.  Was instructed to come to the ED for treatment.  Pt told me his PCP told him he would increase his Metformin to 1000 mg BID and add another oral agent (unsure which one?).  No sick contacts, no symptoms of flu, UTI, or other illness noted.  Does NOT check CBGs at home b/c he does not have a meter.  Discussed with patient treatments being administered (IVF, IV insulin, SQ insulin, etc.).  Discussed with pt that he may need insulin (even if temporary) for home but that Dr. Erlinda Hong will be the one to decide his treatment plan for home.  Patient admitted to poor eating habits sometimes.  Does not drink sweetened drinks but does not really know what to eat to stay healthy.  RD has been consulted for diet education.  Encouraged pt to check his CBGs at least BID at home (fasting and vary the 2nd check of the day either before meals or 2-hours after meals).  Needs to check CBGs TID at home if d/c'd on insulin. Reviewed A1c goals and CBG goals as well.  Educated patient on insulin pen use at home.  Reviewed contents of insulin flexpen starter kit.  Reviewed all steps of insulin pen including attachment of  needle, 2-unit air shot, dialing up dose, giving injection, removing needle, disposal of sharps, storage of unused insulin, disposal of insulin etc.  Patient able to provide successful return demonstration.  Reviewed troubleshooting with insulin pen.  Also reviewed Signs/Symptoms of Hypoglycemia with patient and how to treat Hypoglycemia at home.  Have asked RNs caring for patient to please allow patient to give all injections here in hospital as much as possible for practice.  MD to give patient Rxs for insulin pens and insulin pen needles.    Patient states he has follow-up appt with PCP next Tuesday.    --Will follow patient during hospitalization--  Wyn Quaker RN, MSN, CDE Diabetes Coordinator Inpatient Glycemic Control Team Team Pager: [phone] (8a-5p)

## 2017-09-27 NOTE — Care Management Note (Signed)
Case Management Note  Patient Details  Name: Chad Vega MRN: [id number] Date of Birth: 05/21/1971  SubjectiDallie Dadve/Objective:                  dka  Action/Plan: Date: September 27, 2017 Chad Vega, BSN, [place]3, [place] [phone] Chart and notes review for patient progress and needs. Will follow for case management and discharge needs. No cm or discharge needs present at time of this review. Next review date: [id number] Expected Discharge Date:                  Expected Discharge Plan:  Home/Self Care  In-House Referral:     Discharge planning Services  CM Consult  Post Acute Care Choice:    Choice offered to:     DME Arranged:    DME Agency:     HH Arranged:    HH Agency:     Status of Service:  In process, will continue to follow  If discussed at Long Length of Stay Meetings, dates discussed:    Additional Comments:  Golda AcreDavis, Accalia Rigdon Lynn, RN 09/27/2017, 8:39 AM

## 2017-09-27 NOTE — Discharge Instructions (Signed)
Fingerstick glucose (sugar) goals for home: Before meals: 80-130 mg/dl 2-Hours after meals: less than 180 mg/dl Hemoglobin A1c goal: 7% or less   Insulin Pen Instructions:  1. Remove Insulin pen cap and clean pen 1st with alcohol rub and then clean skin 2nd with alcohol rub 2. Twist insulin pen needle onto pen (right tighty) 3. Remove outer cap and inner cap from needle 4. Dial pen to 2 units and perform prime- press pen to zero and make sure liquid (insulin) comes out of the needle 5. Dial pen to your dose and perform injection into your abdomen 6. Hold needle in skin for 10 seconds after injection 7. Remove needle from insulin pen and discard 8. Place cap back on insulin pen and store safely (at room temperature) 9. Store unused pens in refrigerator and can keep opened insulin pen at room temperature (discard used pen after 30 days)  

## 2017-09-28 DIAGNOSIS — E119 Type 2 diabetes mellitus without complications: Secondary | ICD-10-CM

## 2017-09-28 DIAGNOSIS — IMO0001 Reserved for inherently not codable concepts without codable children: Secondary | ICD-10-CM

## 2017-09-28 DIAGNOSIS — Z794 Long term (current) use of insulin: Secondary | ICD-10-CM | POA: Diagnosis not present

## 2017-09-28 DIAGNOSIS — E876 Hypokalemia: Secondary | ICD-10-CM | POA: Diagnosis not present

## 2017-09-28 DIAGNOSIS — E860 Dehydration: Secondary | ICD-10-CM | POA: Diagnosis present

## 2017-09-28 DIAGNOSIS — E878 Other disorders of electrolyte and fluid balance, not elsewhere classified: Secondary | ICD-10-CM | POA: Diagnosis present

## 2017-09-28 DIAGNOSIS — Z6841 Body Mass Index (BMI) 40.0 and over, adult: Secondary | ICD-10-CM | POA: Diagnosis not present

## 2017-09-28 DIAGNOSIS — E875 Hyperkalemia: Secondary | ICD-10-CM | POA: Diagnosis present

## 2017-09-28 DIAGNOSIS — E1110 Type 2 diabetes mellitus with ketoacidosis without coma: Secondary | ICD-10-CM | POA: Diagnosis present

## 2017-09-28 DIAGNOSIS — Z7984 Long term (current) use of oral hypoglycemic drugs: Secondary | ICD-10-CM | POA: Diagnosis not present

## 2017-09-28 DIAGNOSIS — E871 Hypo-osmolality and hyponatremia: Secondary | ICD-10-CM | POA: Diagnosis present

## 2017-09-28 DIAGNOSIS — I10 Essential (primary) hypertension: Secondary | ICD-10-CM | POA: Diagnosis present

## 2017-09-28 LAB — COMPREHENSIVE METABOLIC PANEL
ALK PHOS: 103 U/L (ref 38–126)
ALT: 44 U/L (ref 17–63)
AST: 40 U/L (ref 15–41)
Albumin: 3.6 g/dL (ref 3.5–5.0)
Anion gap: 9 (ref 5–15)
BILIRUBIN TOTAL: 0.8 mg/dL (ref 0.3–1.2)
BUN: 10 mg/dL (ref 6–20)
CALCIUM: 8.3 mg/dL — AB (ref 8.9–10.3)
CO2: 22 mmol/L (ref 22–32)
CREATININE: 1.01 mg/dL (ref 0.61–1.24)
Chloride: 100 mmol/L — ABNORMAL LOW (ref 101–111)
Glucose, Bld: 393 mg/dL — ABNORMAL HIGH (ref 65–99)
Potassium: 3.7 mmol/L (ref 3.5–5.1)
Sodium: 131 mmol/L — ABNORMAL LOW (ref 135–145)
Total Protein: 6.4 g/dL — ABNORMAL LOW (ref 6.5–8.1)

## 2017-09-28 LAB — CBC
HEMATOCRIT: 41.1 % (ref 39.0–52.0)
HEMOGLOBIN: 13.7 g/dL (ref 13.0–17.0)
MCH: 28 pg (ref 26.0–34.0)
MCHC: 33.3 g/dL (ref 30.0–36.0)
MCV: 84 fL (ref 78.0–100.0)
PLATELETS: 268 10*3/uL (ref 150–400)
RBC: 4.89 MIL/uL (ref 4.22–5.81)
RDW: 13.3 % (ref 11.5–15.5)
WBC: 6 10*3/uL (ref 4.0–10.5)

## 2017-09-28 LAB — GLUCOSE, CAPILLARY
GLUCOSE-CAPILLARY: 487 mg/dL — AB (ref 65–99)
GLUCOSE-CAPILLARY: 430 mg/dL — AB (ref 65–99)
GLUCOSE-CAPILLARY: 427 mg/dL — AB (ref 65–99)
Glucose-Capillary: 428 mg/dL — ABNORMAL HIGH (ref 65–99)

## 2017-09-28 LAB — MAGNESIUM: MAGNESIUM: 2 mg/dL (ref 1.7–2.4)

## 2017-09-28 LAB — GLUCOSE, RANDOM: Glucose, Bld: 505 mg/dL (ref 65–99)

## 2017-09-28 MED ORDER — INSULIN GLARGINE 100 UNIT/ML ~~LOC~~ SOLN
30.0000 [IU] | Freq: Every day | SUBCUTANEOUS | Status: DC
  Administered 2017-09-28: 30 [IU] via SUBCUTANEOUS
  Filled 2017-09-28: qty 0.3

## 2017-09-28 MED ORDER — INSULIN ASPART 100 UNIT/ML ~~LOC~~ SOLN
8.0000 [IU] | Freq: Once | SUBCUTANEOUS | Status: AC
  Administered 2017-09-28: 8 [IU] via SUBCUTANEOUS

## 2017-09-28 MED ORDER — INSULIN ASPART 100 UNIT/ML ~~LOC~~ SOLN
8.0000 [IU] | Freq: Three times a day (TID) | SUBCUTANEOUS | Status: DC
  Administered 2017-09-28 (×2): 8 [IU] via SUBCUTANEOUS

## 2017-09-28 MED ORDER — INSULIN ASPART 100 UNIT/ML ~~LOC~~ SOLN
10.0000 [IU] | Freq: Three times a day (TID) | SUBCUTANEOUS | Status: DC
  Administered 2017-09-29 (×2): 10 [IU] via SUBCUTANEOUS

## 2017-09-28 MED ORDER — INSULIN GLARGINE 100 UNIT/ML ~~LOC~~ SOLN
30.0000 [IU] | Freq: Two times a day (BID) | SUBCUTANEOUS | Status: DC
  Administered 2017-09-28: 30 [IU] via SUBCUTANEOUS
  Filled 2017-09-28 (×2): qty 0.3

## 2017-09-28 NOTE — Progress Notes (Signed)
PROGRESS NOTE  Chad DadWilliam Vega BJY:[id number] DOB: 01/11/1971 DOA: 09/26/2017 PCP: Norm SaltVanstory, Ashley N, PA  HPI/Recap of past 24 hours:  Blood sugar remain significantly elevated despite increase lantus and meal coverage, Blood sugar range from 400-500  Assessment/Plan: Principal Problem:   Diabetes (HCC) Active Problems:   Essential hypertension  Severe hyperglycemia/borderline DKA on presentation: -Blood glucose>700 on presentation, Gap at 14, urine +ketone on presentation -He is admitted to icu/stepdown on insulin drip/ivf - transitioned off insulin drip,  blood sugar still significantly elevated.continue to Increase lantus, increase meal coverage, continue ssi with night time coverage. -a1c 13.3, will  need to d/c on insulin, diabetes RN and dietician consult.  Electrolytes abnormalities: Hyponatremia due to combination of dehydration and pseudohyponatremia, correct blood glucose still low, continue ivf He presented with hyperkalemia, k 6 on presentation , likely due to insulin deficiency,  now hypokalemia, replace. Mag 2.   HTN: continue telmisartan, hold hctz.  Morbid obesity: Body mass index is 45.91 kg/m. Encourage weight loss   Code Status: full  Family Communication: patient   Disposition Plan: need to continue adjust insulin, home in am   Consultants:  none  Procedures:  none  Antibiotics:  none   Objective: BP 140/77 (BP Location: Right Arm)   Pulse 91   Temp 98.4 F (36.9 C) (Oral)   Resp 20   Ht 6\' 2"  (1.88 m)   Wt (!) 162.2 kg (357 lb 9.4 oz)   SpO2 97%   BMI 45.91 kg/m   Intake/Output Summary (Last 24 hours) at 09/28/2017 1745 Last data filed at 09/28/2017 0300 Gross per 24 hour  Intake 992.5 ml  Output -  Net 992.5 ml   Filed Weights   09/26/17 1600 09/26/17 1925  Weight: (!) 162.2 kg (357 lb 9.4 oz) (!) 162.2 kg (357 lb 9.4 oz)    Exam: Patient is examined daily including today on 09/28/2017, exams remain the same as of  yesterday except that has changed    General:  NAD, obese  Cardiovascular: RRR  Respiratory: CTABL  Abdomen: Soft/ND/NT, positive BS  Musculoskeletal: No Edema  Neuro: alert, oriented   Data Reviewed: Basic Metabolic Panel: Recent Labs  Lab 09/26/17 2017 09/26/17 2334 09/27/17 0405 09/27/17 2122 09/28/17 0413 09/28/17 0832  NA 134* 133* 134* 131* 131*  --   K 3.7 3.3* 3.0* 3.9 3.7  --   CL 102 102 101 101 100*  --   CO2 21* 22 23 22 22   --   GLUCOSE 406* 293* 198* 456* 393* 505*  BUN 16 15 13 13 10   --   CREATININE 1.10 1.04 0.95 1.14 1.01  --   CALCIUM 9.5 9.2 8.9 8.8* 8.3*  --   MG  --   --   --   --  2.0  --    Liver Function Tests: Recent Labs  Lab 09/28/17 0413  AST 40  ALT 44  ALKPHOS 103  BILITOT 0.8  PROT 6.4*  ALBUMIN 3.6   No results for input(s): LIPASE, AMYLASE in the last 168 hours. No results for input(s): AMMONIA in the last 168 hours. CBC: Recent Labs  Lab 09/26/17 1043 09/26/17 1056 09/27/17 0405 09/28/17 0413  WBC 6.8  --  7.3 6.0  NEUTROABS 4.7  --   --   --   HGB 15.2 17.0 15.0 13.7  HCT 43.6 50.0 41.6 41.1  MCV 84.2  --  81.9 84.0  PLT 352  --  269 268   Cardiac Enzymes:  No results for input(s): CKTOTAL, CKMB, CKMBINDEX, TROPONINI in the last 168 hours. BNP (last 3 results) No results for input(s): BNP in the last 8760 hours.  ProBNP (last 3 results) No results for input(s): PROBNP in the last 8760 hours.  CBG: Recent Labs  Lab 09/27/17 1646 09/27/17 2054 09/28/17 0745 09/28/17 1136 09/28/17 1714  GLUCAP 431* 435* 487* 428* 430*    Recent Results (from the past 240 hour(s))  MRSA PCR Screening     Status: None   Collection Time: 09/26/17  5:04 PM  Result Value Ref Range Status   MRSA by PCR NEGATIVE NEGATIVE Final    Comment:        The GeneXpert MRSA Assay (FDA approved for NASAL specimens only), is one component of a comprehensive MRSA colonization surveillance program. It is not intended to diagnose  MRSA infection nor to guide or monitor treatment for MRSA infections. Performed at [facility], [address], [place], [place] [address]      Studies: No results found.  Scheduled Meds: . insulin aspart  0-20 Units Subcutaneous TID WC  . insulin aspart  0-5 Units Subcutaneous QHS  . insulin aspart  8 Units Subcutaneous TID WC  . insulin glargine  30 Units Subcutaneous BID  . irbesartan  75 mg Oral Daily    Continuous Infusions:   Time spent: 25 mins I have personally reviewed and interpreted on  09/28/2017 daily labs.  I reviewed all nursing notes,  Vitals.  I have discussed plan of care as described above with RN , patient  on 09/28/2017   Albertine Grates MD, PhD  [facility] Pager [phone]. If 7PM-7AM, please contact night-coverage at [email or web address], password [facility] 09/28/2017, 5:45 PM  LOS: 0 days

## 2017-09-28 NOTE — Progress Notes (Signed)
Inpatient Diabetes Program Recommendations  AACE/ADA: New Consensus Statement on Inpatient Glycemic Control (2015)  Target Ranges:  Prepandial:   less than 140 mg/dL      Peak postprandial:   less than 180 mg/dL (1-2 hours)      Critically ill patients:  140 - 180 mg/dL   Lab Results  Component Value Date   GLUCAP 487 (H) 09/28/2017   HGBA1C 13.3 (H) 09/27/2017    Review of Glycemic Control Results for Chad Vega, Chad Vega (MRN [id number]) as of 09/28/2017 09:52  Ref. Range 09/27/2017 11:14 09/27/2017 16:46 09/27/2017 20:54 09/28/2017 07:45  Glucose-Capillary Latest Ref Range: 65 - 99 mg/dL 409329 (H) 811431 (H) 914435 (H) 487 (H)   History: DM2 Home DM Meds: Metformin 500 mg BID Current Insulin Orders: Lantus 30 units daily, Novolog Resistant Correction Scale/ SSI (0-20 units) TID AC, Novolog 8 Units TIDAC, Novolog 0-5 Units QHS         Inpatient Diabetes Program Recommendations:    Noted changes made this AM to Lantus 30 Units QD, and [facility]. Blood sugars are significantly increased this AM. Should improve with insulin adjustments. Will continue to watch BS given changes made to orders and will plan to check back in on patient today to review discussion from yesterday.   Thanks, Chad RaveLauren Nianna Igo, MSN, RNC-OB Diabetes Coordinator [phone] (8a-5p)

## 2017-09-28 NOTE — Progress Notes (Signed)
Inpatient Diabetes Program Recommendations  AACE/ADA: New Consensus Statement on Inpatient Glycemic Control (2015)  Target Ranges:  Prepandial:   less than 140 mg/dL      Peak postprandial:   less than 180 mg/dL (1-2 hours)      Critically ill patients:  140 - 180 mg/dL   Lab Results  Component Value Date   GLUCAP 428 (H) 09/28/2017   HGBA1C 13.3 (H) 09/27/2017    Spoke with patient prior to discharge regarding diabetes management and reviewing insulin administration. Witness patient administering insulin; no problems. Patient verbalized comfortability. We discussed diet, importance of proper glycemic management, and checking blood sugars. Provided outpatient education referral and plans to attend. Aware of CHO counting and already starting to make modifications. Patient has no further questions.   Thanks, Lujean RaveLauren Vicy Medico, MSN, RNC-OB Diabetes Coordinator [phone] (8a-5p)

## 2017-09-28 NOTE — Progress Notes (Signed)
  RD consulted for nutrition education regarding diabetes.   Lab Results  Component Value Date   HGBA1C 13.3 (H) 09/27/2017    RD provided "Carbohydrate Counting for People with Diabetes" handout from the Academy of Nutrition and Dietetics. Discussed different food groups and their effects on blood sugar, emphasizing carbohydrate-containing foods. Provided list of carbohydrates and recommended serving sizes of common foods.  Discussed importance of controlled and consistent carbohydrate intake throughout the day. Provided examples of ways to balance meals/snacks and encouraged intake of high-fiber, whole grain complex carbohydrates. Teach back method used.  Pt typically eats three meals per day with snacks. Admits to drinking sugary beverages sometimes but aware that those affect his blood sugar. Pt reports right before admission he experienced extreme thirst and would often resort to coconut water and PowerAde. Discussed beverage alternatives. Talked about each meal having a protein, carbohydrate, and vegetable. Went over snack ideas that includes protein and carbohydrate options. Pt asked many questions and seemed motivated to make changes.   Expect good compliance.  Body mass index is 45.91 kg/m. Pt meets criteria for morbid obesity based on current BMI.  Current diet order is heart healthy/carbohydrate modified. Labs and medications reviewed. No further nutrition interventions warranted at this time. RD contact information provided. If additional nutrition issues arise, please re-consult RD.  Vanessa Kickarly Nicholad Kautzman RD, LDN Clinical Nutrition Pager # [phone]- [phone]

## 2017-09-29 DIAGNOSIS — E66813 Obesity, class 3: Secondary | ICD-10-CM

## 2017-09-29 DIAGNOSIS — E871 Hypo-osmolality and hyponatremia: Secondary | ICD-10-CM

## 2017-09-29 LAB — BASIC METABOLIC PANEL
Anion gap: 8 (ref 5–15)
BUN: 10 mg/dL (ref 6–20)
CO2: 22 mmol/L (ref 22–32)
Calcium: 8.5 mg/dL — ABNORMAL LOW (ref 8.9–10.3)
Chloride: 101 mmol/L (ref 101–111)
Creatinine, Ser: 0.86 mg/dL (ref 0.61–1.24)
GFR calc Af Amer: >60 mL/min (ref >60)
GFR calc non Af Amer: >60 mL/min (ref >60)
GLUCOSE: 326 mg/dL — AB (ref 65–99)
POTASSIUM: 3.5 mmol/L (ref 3.5–5.1)
SODIUM: 131 mmol/L — AB (ref 135–145)

## 2017-09-29 LAB — GLUCOSE, CAPILLARY
GLUCOSE-CAPILLARY: 372 mg/dL — AB (ref 65–99)
GLUCOSE-CAPILLARY: 384 mg/dL — AB (ref 65–99)

## 2017-09-29 LAB — LIPID PANEL
CHOLESTEROL: 163 mg/dL (ref 0–200)
HDL: 38 mg/dL — AB (ref >40)
LDL Cholesterol: 94 mg/dL (ref 0–99)
Total CHOL/HDL Ratio: 4.3 RATIO
Triglycerides: 156 mg/dL — ABNORMAL HIGH (ref <150)
VLDL: 31 mg/dL (ref 0–40)

## 2017-09-29 LAB — TSH: TSH: 3.567 u[IU]/mL (ref 0.350–4.500)

## 2017-09-29 MED ORDER — INSULIN LISPRO 100 UNIT/ML (KWIKPEN): PEN_INJECTOR | SUBCUTANEOUS | 1 refills | Status: DC

## 2017-09-29 MED ORDER — INSULIN GLARGINE 100 UNIT/ML SOLOSTAR PEN: 35.0000 [IU] | PEN_INJECTOR | Freq: Two times a day (BID) | SUBCUTANEOUS | 1 refills | Status: DC

## 2017-09-29 MED ORDER — BLOOD GLUCOSE MONITOR KIT: PACK | 0 refills | Status: DC

## 2017-09-29 MED ORDER — INSULIN DETEMIR 100 UNIT/ML FLEXPEN: 35.0000 [IU] | PEN_INJECTOR | Freq: Two times a day (BID) | SUBCUTANEOUS | 1 refills | Status: DC

## 2017-09-29 MED ORDER — BLOOD GLUCOSE MONITOR KIT: PACK | 0 refills | Status: AC

## 2017-09-29 MED ORDER — INSULIN LISPRO 100 UNIT/ML (KWIKPEN): 10.0000 [IU] | PEN_INJECTOR | Freq: Three times a day (TID) | SUBCUTANEOUS | 1 refills | Status: DC

## 2017-09-29 MED ORDER — INSULIN ASPART 100 UNIT/ML FLEXPEN: PEN_INJECTOR | SUBCUTANEOUS | 1 refills | Status: DC

## 2017-09-29 MED ORDER — INSULIN ASPART 100 UNIT/ML FLEXPEN: 10.0000 [IU] | PEN_INJECTOR | Freq: Three times a day (TID) | SUBCUTANEOUS | 1 refills | Status: DC

## 2017-09-29 MED ORDER — INSULIN GLARGINE 100 UNIT/ML ~~LOC~~ SOLN
35.0000 [IU] | Freq: Two times a day (BID) | SUBCUTANEOUS | Status: DC
  Administered 2017-09-29: 35 [IU] via SUBCUTANEOUS
  Filled 2017-09-29 (×2): qty 0.35

## 2017-09-29 NOTE — Progress Notes (Signed)
Discharge instructions reviewed with patient. Patient verbalized understanding. Patient discharged via private vehicle. 

## 2017-10-20 ENCOUNTER — Encounter: Payer: Self-pay | Admitting: Registered"

## 2017-10-20 ENCOUNTER — Encounter: Payer: Managed Care, Other (non HMO) | Attending: Internal Medicine | Admitting: Registered"

## 2017-10-20 DIAGNOSIS — Z713 Dietary counseling and surveillance: Secondary | ICD-10-CM | POA: Diagnosis not present

## 2017-10-20 DIAGNOSIS — I10 Essential (primary) hypertension: Secondary | ICD-10-CM | POA: Diagnosis not present

## 2017-10-20 DIAGNOSIS — K219 Gastro-esophageal reflux disease without esophagitis: Secondary | ICD-10-CM | POA: Insufficient documentation

## 2017-10-20 DIAGNOSIS — E119 Type 2 diabetes mellitus without complications: Secondary | ICD-10-CM

## 2017-10-20 NOTE — Progress Notes (Signed)
Diabetes Self-Management Education  Visit Type: First/Initial  Appt. Start Time: 0815 Appt. End Time: 0945  10/23/2017  Mr. Chad Vega, identified by name and date of birth, is a 47 y.o. male with a diagnosis of Diabetes: Type 2.   ASSESSMENT Chad Vega states fatigue was first sign BG was high then urinating every 30 min. Chad Vega states he has made some changes since getting the diagnosis which have helped to bring down SMBG numbers. Pt states he also drinks alkaline water because he has heard that helps.   Sleep: Chad Vega states for awhile sleep was not good, especially when he was having frequent urination, but recently has improved and Chad Vega states he now gets adequate rest. Chad Vega states having low stress: 1 out 10.  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   Diabetes Self-Management Education - 10/20/17 0837      Visit Information   Visit Type  First/Initial      Initial Visit   Diabetes Type  Type 2    Are you currently following a meal plan?  Yes    What type of meal plan do you follow?  low carb, low sodium    Are you taking your medications as prescribed?  Yes    Date Diagnosed  1 yr ago      Health Coping   How would you rate your overall health?  Good      Psychosocial Assessment   Chad Vega Belief/Attitude about Diabetes  Motivated to manage diabetes    How often do you need to have someone help you when you read instructions, pamphlets, or other written materials from your doctor or pharmacy?  1 - Never    What is the last grade level you completed in school?  12      Complications   Last HgB A1C per Chad Vega/outside source  13.3 % 13.3 in hospital, 10.6 in dr. office 1 mo prior    How often do you check your blood sugar?  3-4 times/day    Fasting Blood glucose range (mg/dL)  -- 102150    Postprandial Blood glucose range (mg/dL)  725-366;44-034;[id number];70-129;130-179 109-    Number of hypoglycemic episodes per month  0    Number of hyperglycemic episodes per week  0    Have you had a dilated eye exam in the past 12 months?  No    Have you had a dental exam in the past 12 months?  No    Are you checking your feet?  Yes    How many days per week are you checking your feet?  7      Dietary Intake   Breakfast  crispy chicken on bun    Snack (morning)  trail mix    Lunch  [employer] cob salad or [employer] SW grilled chicken     Snack (afternoon)  trail mix (sweet & salty)    Dinner  occassional eats out [employer] chicken, pintos OR bake chicken, grilled chicken salad,     Snack (evening)  yogurt OR fruit    Beverage(s)  water, once in a great while diet soda      Exercise   Exercise Type  Light (walking / raking leaves) a little stretching in the morning    How many days per week to you exercise?  3    How many minutes per day do you exercise?  60    Total minutes per week of  exercise  180      Chad Vega Education   Previous Diabetes Education  Yes (please comment) in hospital    Disease state   Definition of diabetes, type 1 and 2, and the diagnosis of diabetes    Nutrition management   Role of diet in the treatment of diabetes and the relationship between the three main macronutrients and blood glucose level;Carbohydrate counting;Food label reading, portion sizes and measuring food.    Monitoring  Identified appropriate SMBG and/or A1C goals.    Chronic complications  Assessed and discussed foot care and prevention of foot problems;Identified and discussed with Chad Vega  current chronic complications;Retinopathy and reason for yearly dilated eye exams;Relationship between chronic complications and blood glucose control      Individualized Goals (developed by Chad Vega)   Nutrition  General guidelines for healthy choices and portions discussed    Monitoring   test my blood glucose as discussed      Outcomes   Expected Outcomes  Demonstrated interest in learning. Expect positive outcomes    Future DMSE  4-6 wks     Program Status  Not Completed     Individualized Plan for Diabetes Self-Management Training:   Learning Objective:  Chad Vega will have a greater understanding of diabetes self-management. Chad Vega education plan is to attend individual and/or group sessions per assessed needs and concerns.   Chad Vega Instructions  Check with Dr. Greggory Stallion about the vitamin D question of hospital notes that Chad Vega (you) can stop taking. Look into [employer] or other Continuous Glucose Monitor (CGM). Look into reduced fee clinic for eye exam.  Consider making your dental exam appointment. Great job on cooking at home often and making balanced meals. Read Nutrition Facts label for total carbohydrates, saturated fat, and sodium Continue your exercise routine as tolerated. Continue taking your medication as directed by your MD  Expected Outcomes:  Demonstrated interest in learning. Expect positive outcomes  Education material provided: A1C conversion sheet, My Plate, Snack sheet and Carbohydrate counting sheet  If problems or questions, Chad Vega to contact team via:  Phone and MyChart  Future DSME appointment: 4-6 wks

## 2017-10-20 NOTE — Patient Instructions (Addendum)
Check with Dr. Greggory StallionGeorge about the vitamin D question of hospital notes that patient (you) can stop taking. Look into [employer] or other Continuous Glucose Monitor (CGM). Look into reduced fee clinic for eye exam.  Consider making your dental exam appointment. Great job on cooking at home often and making balanced meals. Read Nutrition Facts label for total carbohydrates, saturated fat, and sodium Continue your exercise routine as tolerated. Continue taking your medication as directed by your MD

## 2017-10-23 DIAGNOSIS — E119 Type 2 diabetes mellitus without complications: Secondary | ICD-10-CM | POA: Insufficient documentation

## 2018-04-05 ENCOUNTER — Encounter: Payer: Self-pay | Admitting: Pulmonary Disease

## 2018-04-05 ENCOUNTER — Ambulatory Visit (INDEPENDENT_AMBULATORY_CARE_PROVIDER_SITE_OTHER): Payer: Managed Care, Other (non HMO) | Admitting: Pulmonary Disease

## 2018-04-05 VITALS — BP 120/86 | HR 80 | Ht 74.0 in | Wt 372.6 lb

## 2018-04-05 DIAGNOSIS — G4733 Obstructive sleep apnea (adult) (pediatric): Secondary | ICD-10-CM

## 2018-04-05 NOTE — Patient Instructions (Signed)
Schedule home sleep study.   

## 2018-04-05 NOTE — Assessment & Plan Note (Signed)
Given excessive daytime somnolence, narrow pharyngeal exam, witnessed apneas & loud snoring, obstructive sleep apnea is very likely & an overnight polysomnogram will be scheduled as a home study. The pathophysiology of obstructive sleep apnea , it's cardiovascular consequences & modes of treatment including CPAP were discused with the patient in detail & they evidenced understanding.  Pretest probability is intermediate.  Seems like he had a 2 channel study earlier which did not give a good recording.  I am hopeful that we will be able to get a better tracing on our home sleep test attempt

## 2018-04-05 NOTE — Progress Notes (Signed)
Subjective:    Patient ID: Chad Vega, male    DOB: 04/04/1971, 47 y.o.   MRN: [id number]  HPI  47 year old obese diabetic presents for evaluation of sleep disordered breathing. His girlfriend has witnessed apneas and noted loud snoring.  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  Epworth sleepiness score is 10 and he reports sleepiness while watching TV, lying down to rest in the afternoon and sitting quietly after lunch. Bedtime is between 1030 and 11:30 PM, sleep latency is about 30 minutes, he sleeps on his side with 2 pillows, denies nocturnal awakenings or nocturia and is out of bed by 5 AM feeling tired without dryness of mouth or headaches.  When he works the early shift is back home before 3 PM and will occasionally nap during the day There is no history suggestive of cataplexy, sleep paralysis or parasomnias  He has lost about 15 pounds to his current weight of 372 pounds. He is an insulin requiring diabetes and last HbA1c was 6.8   Past Medical History:  Diagnosis Date  . Diabetes mellitus without complication (HCC)    History reviewed. No pertinent surgical history.  No Known Allergies  Social History   Socioeconomic History  . Marital status: Single    Spouse name: Not on file  . Number of children: Not on file  . Years of education: Not on file  . Highest education level: Not on file  Occupational History  . Not on file  Social Needs  . Financial resource strain: Not hard at all  . Food insecurity:    Worry: Never true    Inability: Never true  . Transportation needs:    Medical: No    Non-medical: No  Tobacco Use  . Smoking status: Never Smoker  . Smokeless tobacco: Never Used  Substance and Sexual Activity  . Alcohol use: No    Frequency: Never  . Drug use: No  . Sexual activity: Yes    Partners: Female    Birth control/protection: None  Lifestyle  . Physical activity:    Days per week: Patient refused   Minutes per session: Patient refused  . Stress: Not at all  Relationships  . Social connections:    Talks on phone: Once a week    Gets together: Once a week    Attends religious service: More than 4 times per year    Active member of club or organization: Yes    Attends meetings of clubs or organizations: Never    Relationship status: Living with partner  . Intimate partner violence:    Fear of current or ex partner: No    Emotionally abused: No    Physically abused: No    Forced sexual activity: No  Other Topics Concern  . Not on file  Social History Narrative  . Not on file     No family history of early CAD, mother had diabetes   Review of Systems Constitutional: negative for anorexia, fevers and sweats  Eyes: negative for irritation, redness and visual disturbance  Ears, nose, mouth, throat, and face: negative for earaches, epistaxis, nasal congestion and sore throat  Respiratory: negative for cough, dyspnea on exertion, sputum and wheezing  Cardiovascular: negative for chest pain, dyspnea, lower extremity edema, orthopnea, palpitations and syncope  Gastrointestinal: negative for abdominal pain, constipation, diarrhea, melena, nausea and vomiting  Genitourinary:negative for dysuria, frequency and hematuria  Hematologic/lymphatic: negative for bleeding, easy bruising and lymphadenopathy  Musculoskeletal:negative  for arthralgias, muscle weakness and stiff joints  Neurological: negative for coordination problems, gait problems, headaches and weakness  Endocrine: negative for diabetic symptoms including polydipsia, polyuria and weight loss     Objective:   Physical Exam  Gen. Pleasant, morbidly obese, in no distress ENT -class II airway, normal bite, neck circumference 18 inches Neck: No JVD, no thyromegaly, no carotid bruits Lungs: no use of accessory muscles, no dullness to percussion, decreased without rales or rhonchi  Cardiovascular: Rhythm regular, heart sounds   normal, no murmurs or gallops, no peripheral edema Musculoskeletal: No deformities, no cyanosis or clubbing , no tremors       Assessment & Plan:

## 2018-06-07 DIAGNOSIS — G4733 Obstructive sleep apnea (adult) (pediatric): Secondary | ICD-10-CM

## 2018-06-08 ENCOUNTER — Telehealth: Payer: Self-pay | Admitting: Pulmonary Disease

## 2018-06-08 ENCOUNTER — Other Ambulatory Visit: Payer: Self-pay | Admitting: *Deleted

## 2018-06-08 DIAGNOSIS — G4733 Obstructive sleep apnea (adult) (pediatric): Secondary | ICD-10-CM

## 2018-06-08 NOTE — Telephone Encounter (Signed)
Per RA, HST showed severe OSA with 32 events per hour. RA recommends a cpap titration as the next step.   Spoke with patient. He stated that during his test, he was severely congested and wonders if this played any role in his results. He wants to know if it is possible to allow him to repeat the HST. Advised him that I would ask RA first.   RA, please advise. Thanks!

## 2018-06-08 NOTE — Telephone Encounter (Signed)
Spoke to patient. He is aware of RA's recs and still wants to proceed with a repeat of the HST. Advised him that one of the PCCs would call him to get this rescheduled, he verbalized understanding.    PCCs, can you all help me to get him setup again with a HST? Thanks!

## 2018-06-08 NOTE — Telephone Encounter (Signed)
This should not affect the test because congestion should not cause him to stop breathing But OK to repeat if he is insistent

## 2018-06-18 DIAGNOSIS — G4733 Obstructive sleep apnea (adult) (pediatric): Secondary | ICD-10-CM | POA: Diagnosis not present

## 2018-06-19 NOTE — Addendum Note (Signed)
Addended by: Boone Master E on: 06/19/2018 11:55 AM   Modules accepted: Orders

## 2018-06-26 DIAGNOSIS — G4733 Obstructive sleep apnea (adult) (pediatric): Secondary | ICD-10-CM

## 2018-06-28 ENCOUNTER — Telehealth: Payer: Self-pay | Admitting: Pulmonary Disease

## 2018-06-28 DIAGNOSIS — G4733 Obstructive sleep apnea (adult) (pediatric): Secondary | ICD-10-CM

## 2018-06-28 NOTE — Telephone Encounter (Signed)
Per RA, repeat HST still shows severe OSA with 31 events per hour. Recommends a CPAP titration study as the next step.

## 2018-06-28 NOTE — Telephone Encounter (Signed)
Spoke with patient. He is aware of results. He wishes to proceed with the CPAP titration study. Advised patient that I would place the order today and the PCCs would contact him to get this scheduled. He verbalized understanding. Nothing further needed at time of call.

## 2018-07-29 ENCOUNTER — Telehealth: Payer: Self-pay | Admitting: Pulmonary Disease

## 2018-07-29 DIAGNOSIS — G4733 Obstructive sleep apnea (adult) (pediatric): Secondary | ICD-10-CM

## 2018-07-29 NOTE — Telephone Encounter (Signed)
-----   Message from Tobe SosSally E Ottinger sent at 07/27/2018 12:12 PM EST ----- Pt's ins denied his cpap titration states he needs to go on auth cpap through dme 1st I will need orders Tobe SosSally E Ottinger

## 2018-07-29 NOTE — Telephone Encounter (Signed)
AutoCPAP 5-15 cm, mask of choice OV in 6 wks with NP/ me

## 2018-07-31 ENCOUNTER — Other Ambulatory Visit: Payer: Self-pay

## 2018-07-31 ENCOUNTER — Encounter (HOSPITAL_COMMUNITY): Payer: Self-pay | Admitting: Emergency Medicine

## 2018-07-31 DIAGNOSIS — I10 Essential (primary) hypertension: Secondary | ICD-10-CM | POA: Diagnosis not present

## 2018-07-31 DIAGNOSIS — J111 Influenza due to unidentified influenza virus with other respiratory manifestations: Secondary | ICD-10-CM | POA: Insufficient documentation

## 2018-07-31 DIAGNOSIS — Z794 Long term (current) use of insulin: Secondary | ICD-10-CM | POA: Diagnosis not present

## 2018-07-31 DIAGNOSIS — R000 Tachycardia, unspecified: Secondary | ICD-10-CM | POA: Insufficient documentation

## 2018-07-31 DIAGNOSIS — E119 Type 2 diabetes mellitus without complications: Secondary | ICD-10-CM | POA: Diagnosis not present

## 2018-07-31 DIAGNOSIS — Z79899 Other long term (current) drug therapy: Secondary | ICD-10-CM | POA: Insufficient documentation

## 2018-07-31 DIAGNOSIS — R509 Fever, unspecified: Secondary | ICD-10-CM | POA: Diagnosis present

## 2018-07-31 MED ORDER — ACETAMINOPHEN 325 MG PO TABS
650.0000 mg | ORAL_TABLET | Freq: Once | ORAL | Status: AC | PRN
  Administered 2018-07-31: 650 mg via ORAL
  Filled 2018-07-31: qty 2

## 2018-07-31 NOTE — ED Triage Notes (Signed)
Pt reports flu like symptoms with body aches and fever over the last several days. Not  OTC medications taken prior to arrival.

## 2018-08-01 ENCOUNTER — Emergency Department (HOSPITAL_COMMUNITY)
Admission: EM | Admit: 2018-08-01 | Discharge: 2018-08-01 | Disposition: A | Discharge status: Home / Self Care | Payer: Managed Care, Other (non HMO) | Attending: Emergency Medicine | Admitting: Emergency Medicine

## 2018-08-01 ENCOUNTER — Emergency Department (HOSPITAL_COMMUNITY): Payer: Managed Care, Other (non HMO)

## 2018-08-01 DIAGNOSIS — J111 Influenza due to unidentified influenza virus with other respiratory manifestations: Secondary | ICD-10-CM

## 2018-08-01 DIAGNOSIS — R69 Illness, unspecified: Secondary | ICD-10-CM

## 2018-08-01 MED ORDER — IBUPROFEN 800 MG PO TABS
800.0000 mg | ORAL_TABLET | Freq: Once | ORAL | Status: AC
  Administered 2018-08-01: 800 mg via ORAL
  Filled 2018-08-01: qty 1

## 2018-08-01 MED ORDER — ONDANSETRON 4 MG PO TBDP: 4.0000 mg | ORAL_TABLET | Freq: Three times a day (TID) | ORAL | 0 refills | Status: DC | PRN

## 2018-08-01 MED ORDER — OSELTAMIVIR PHOSPHATE 75 MG PO CAPS: 75.0000 mg | ORAL_CAPSULE | Freq: Two times a day (BID) | ORAL | 0 refills | Status: DC

## 2018-08-01 NOTE — ED Notes (Signed)
Patient returned from X-ray 

## 2018-08-01 NOTE — ED Notes (Signed)
Patient transported to X-ray 

## 2018-08-01 NOTE — ED Notes (Signed)
ED Provider at bedside. 

## 2018-08-01 NOTE — Discharge Instructions (Signed)
Your chest x-ray did not show any evidence of pneumonia.  We suspect that your symptoms are due to a flulike illness.  Take 600 mg ibuprofen every 6 hours for management of fever and body aches.  You may supplement this with 1000 mg Tylenol every 8 hours as needed.  Take Tamiflu as prescribed until finished.  You may use Zofran if you develop any nausea to prevent vomiting.  Follow-up with your primary care doctor to ensure resolution of symptoms.
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
## 2018-08-13 NOTE — Telephone Encounter (Signed)
Spoke with patient, he is aware of recommendations. Advised him that I would call him in about 2-3 weeks to see if he has received his machine, he verbalized understanding.   Will go ahead and place order today. Nothing further needed at time of call.

## 2018-08-16 ENCOUNTER — Encounter (HOSPITAL_BASED_OUTPATIENT_CLINIC_OR_DEPARTMENT_OTHER): Payer: Managed Care, Other (non HMO)

## 2018-08-20 ENCOUNTER — Encounter (HOSPITAL_BASED_OUTPATIENT_CLINIC_OR_DEPARTMENT_OTHER): Payer: Managed Care, Other (non HMO)

## 2018-08-23 ENCOUNTER — Encounter: Payer: Self-pay | Admitting: Pulmonary Disease

## 2018-08-23 NOTE — Telephone Encounter (Signed)
Error

## 2020-04-15 ENCOUNTER — Other Ambulatory Visit: Payer: Self-pay

## 2020-04-15 ENCOUNTER — Emergency Department (HOSPITAL_BASED_OUTPATIENT_CLINIC_OR_DEPARTMENT_OTHER)
Admission: EM | Admit: 2020-04-15 | Discharge: 2020-04-15 | Disposition: A | Discharge status: Home / Self Care | Payer: BC Managed Care – PPO | Attending: Emergency Medicine | Admitting: Emergency Medicine

## 2020-04-15 ENCOUNTER — Encounter (HOSPITAL_BASED_OUTPATIENT_CLINIC_OR_DEPARTMENT_OTHER): Payer: Self-pay | Admitting: Emergency Medicine

## 2020-04-15 DIAGNOSIS — E1165 Type 2 diabetes mellitus with hyperglycemia: Secondary | ICD-10-CM | POA: Diagnosis not present

## 2020-04-15 DIAGNOSIS — I10 Essential (primary) hypertension: Secondary | ICD-10-CM | POA: Insufficient documentation

## 2020-04-15 DIAGNOSIS — R739 Hyperglycemia, unspecified: Secondary | ICD-10-CM

## 2020-04-15 DIAGNOSIS — Z79899 Other long term (current) drug therapy: Secondary | ICD-10-CM | POA: Diagnosis not present

## 2020-04-15 DIAGNOSIS — Z794 Long term (current) use of insulin: Secondary | ICD-10-CM | POA: Diagnosis not present

## 2020-04-15 LAB — CBC WITH DIFFERENTIAL/PLATELET
Abs Immature Granulocytes: 0.01 10*3/uL (ref 0.00–0.07)
Basophils Absolute: 0 10*3/uL (ref 0.0–0.1)
Basophils Relative: 1 %
Eosinophils Absolute: 0.1 10*3/uL (ref 0.0–0.5)
Eosinophils Relative: 1 %
HCT: 43.8 % (ref 39.0–52.0)
Hemoglobin: 14.7 g/dL (ref 13.0–17.0)
Immature Granulocytes: 0 %
Lymphocytes Relative: 36 %
Lymphs Abs: 2.3 10*3/uL (ref 0.7–4.0)
MCH: 28.4 pg (ref 26.0–34.0)
MCHC: 33.6 g/dL (ref 30.0–36.0)
MCV: 84.7 fL (ref 80.0–100.0)
Monocytes Absolute: 1 10*3/uL (ref 0.1–1.0)
Monocytes Relative: 15 %
Neutro Abs: 3 10*3/uL (ref 1.7–7.7)
Neutrophils Relative %: 47 %
Platelets: 319 10*3/uL (ref 150–400)
RBC: 5.17 MIL/uL (ref 4.22–5.81)
RDW: 13 % (ref 11.5–15.5)
WBC: 6.5 10*3/uL (ref 4.0–10.5)
nRBC: 0 % (ref 0.0–0.2)

## 2020-04-15 LAB — COMPREHENSIVE METABOLIC PANEL
ALT: 42 U/L (ref 0–44)
AST: 30 U/L (ref 15–41)
Albumin: 4.3 g/dL (ref 3.5–5.0)
Alkaline Phosphatase: 94 U/L (ref 38–126)
Anion gap: 16 — ABNORMAL HIGH (ref 5–15)
BUN: 14 mg/dL (ref 6–20)
CO2: 20 mmol/L — ABNORMAL LOW (ref 22–32)
Calcium: 9.2 mg/dL (ref 8.9–10.3)
Chloride: 92 mmol/L — ABNORMAL LOW (ref 98–111)
Creatinine, Ser: 1.04 mg/dL (ref 0.61–1.24)
GFR calc Af Amer: >60 mL/min (ref >60)
GFR calc non Af Amer: >60 mL/min (ref >60)
Glucose, Bld: 493 mg/dL — ABNORMAL HIGH (ref 70–99)
Potassium: 4 mmol/L (ref 3.5–5.1)
Sodium: 128 mmol/L — ABNORMAL LOW (ref 135–145)
Total Bilirubin: 1.1 mg/dL (ref 0.3–1.2)
Total Protein: 8.1 g/dL (ref 6.5–8.1)

## 2020-04-15 LAB — CBG MONITORING, ED
Glucose-Capillary: 564 mg/dL (ref 70–99)
Glucose-Capillary: 411 mg/dL — ABNORMAL HIGH (ref 70–99)
Glucose-Capillary: 340 mg/dL — ABNORMAL HIGH (ref 70–99)
Glucose-Capillary: 327 mg/dL — ABNORMAL HIGH (ref 70–99)

## 2020-04-15 MED ORDER — INSULIN ASPART 100 UNIT/ML IV SOLN
10.0000 [IU] | Freq: Once | INTRAVENOUS | Status: AC
  Filled 2020-04-15: qty 0.1

## 2020-04-15 MED ORDER — LACTATED RINGERS IV BOLUS
2000.0000 mL | Freq: Once | INTRAVENOUS | Status: AC
  Administered 2020-04-15: 2000 mL via INTRAVENOUS

## 2020-04-15 MED ORDER — INSULIN NPH (HUMAN) (ISOPHANE) 100 UNIT/ML ~~LOC~~ SUSP
SUBCUTANEOUS | Status: AC
  Filled 2020-04-15: qty 10

## 2020-04-15 MED ORDER — INSULIN REGULAR HUMAN 100 UNIT/ML IJ SOLN
10.0000 [IU] | Freq: Once | INTRAMUSCULAR | Status: DC
  Filled 2020-04-15: qty 3

## 2020-04-15 MED ORDER — INSULIN ASPART 100 UNIT/ML ~~LOC~~ SOLN
SUBCUTANEOUS | Status: AC
  Administered 2020-04-15: 10 [IU] via INTRAVENOUS
  Filled 2020-04-15: qty 10

## 2020-04-15 NOTE — ED Triage Notes (Signed)
Pt here with hyperglycemia after covid immunization. Unable to get sugar down. Weak and cloudy headed, frequent urination and thirst. CBG 564 in triage

## 2020-04-15 NOTE — ED Notes (Signed)
Patient denies pain and is resting comfortably.  

## 2020-04-15 NOTE — ED Provider Notes (Signed)
[facility] EMERGENCY DEPARTMENT Provider Note   CSN: [id number] Arrival date & time: 04/15/20  1712     History Chief Complaint  Patient presents with  . Hyperglycemia    Kellen Dutch is a 49 y.o. male.  Patient is a 49 year old male with a history of diabetes that he takes Metformin and insulin for daily.  He reports for the last 1 month his blood sugar has been hard to control.  He has had polyuria, polydipsia and a 15 pound weight loss.  He has not changed his medications and this all started after he received his second Covid vaccine.  He denies any diarrhea, abdominal pain, chest pain or shortness of breath.  He attempted to contact his doctor today and was told to come to the emergency room.  He typically takes 35 units of insulin morning and night and 1000 mg of Metformin twice daily.  He is taking those medications today.  He denies any URI symptoms, fever or headaches.  The history is provided by the patient.  Hyperglycemia      Past Medical History:  Diagnosis Date  . Diabetes mellitus without complication [facility])     Patient Active Problem List   Diagnosis Date Noted  . OSA (obstructive sleep apnea) 04/05/2018  . Controlled type 2 diabetes mellitus without complication ([place]) 50/93/2671  . Hyponatremia   . Obesity, Class III, BMI 40-49.9 (morbid obesity) ([place])   . Insulin dependent diabetes mellitus 09/28/2017  . Diabetes ([place]) 09/26/2017  . Essential hypertension 09/26/2017  . Hyperglycemia   . Hyperkalemia     History reviewed. No pertinent surgical history.     History reviewed. No pertinent family history.  Social History   Tobacco Use  . Smoking status: Never Smoker  . Smokeless tobacco: Never Used  Vaping Use  . Vaping Use: Never used  Substance Use Topics  . Alcohol use: No  . Drug use: No    Home Medications Prior to Admission medications   Medication Sig Start Date End Date Taking? Authorizing Provider  blood glucose meter  kit and supplies KIT Dispense based on patient and insurance preference. Use up to four times daily as directed. (FOR ICD-9 250.00, 250.01). 09/29/17   Florencia Reasons, MD  esomeprazole (NEXIUM) 40 MG capsule Take 40 mg by mouth daily. 09/15/17   [provider]  Insulin Detemir (LEVEMIR FLEXTOUCH) 100 UNIT/ML Pen Inject 35 Units into the skin 2 (two) times daily. 09/29/17   Florencia Reasons, MD  metFORMIN (GLUCOPHAGE) 1000 MG tablet Take 1,000 mg by mouth 2 (two) times daily. 02/05/20   [provider]  metFORMIN (GLUCOPHAGE) 500 MG tablet Take 2 tablets (1,000 mg total) by mouth 2 (two) times daily. 09/27/17   Florencia Reasons, MD  ondansetron (ZOFRAN ODT) 4 MG disintegrating tablet Take 1 tablet (4 mg total) by mouth every 8 (eight) hours as needed for nausea. 08/01/18   Antonietta Breach, PA-C  oseltamivir (TAMIFLU) 75 MG capsule Take 1 capsule (75 mg total) by mouth every 12 (twelve) hours. 08/01/18   Antonietta Breach, PA-C  telmisartan-hydrochlorothiazide (MICARDIS HCT) 80-25 MG tablet Take 1 tablet by mouth daily. 09/10/17   [provider]    Allergies    Patient has no known allergies.  Review of Systems   Review of Systems  All other systems reviewed and are negative.   Physical Exam Updated Vital Signs BP (!) 132/114   Pulse 100   Temp 98.9 F (37.2 C)   Resp 18  SpO2 100%   Physical Exam Vitals and nursing note reviewed.  Constitutional:      General: He is not in acute distress.    Appearance: Normal appearance. He is well-developed. He is obese.  HENT:     Head: Normocephalic and atraumatic.  Eyes:     Conjunctiva/sclera: Conjunctivae normal.     Pupils: Pupils are equal, round, and reactive to light.  Cardiovascular:     Rate and Rhythm: Normal rate and regular rhythm.     Heart sounds: No murmur heard.   Pulmonary:     Effort: Pulmonary effort is normal. No respiratory distress.     Breath sounds: Normal breath sounds. No wheezing or rales.  Abdominal:     General:  There is no distension.     Palpations: Abdomen is soft.     Tenderness: There is no abdominal tenderness. There is no guarding or rebound.  Musculoskeletal:        General: No tenderness. Normal range of motion.     Cervical back: Normal range of motion and neck supple.  Skin:    General: Skin is warm and dry.     Findings: No erythema or rash.  Neurological:     Mental Status: He is alert and oriented to person, place, and time.  Psychiatric:        Behavior: Behavior normal.     ED Results / Procedures / Treatments   Labs (all labs ordered are listed, but only abnormal results are displayed) Labs Reviewed  COMPREHENSIVE METABOLIC PANEL - Abnormal; Notable for the following components:      Result Value   Sodium 128 (*)    Chloride 92 (*)    CO2 20 (*)    Glucose, Bld 493 (*)    Anion gap 16 (*)    All other components within normal limits  CBG MONITORING, ED - Abnormal; Notable for the following components:   Glucose-Capillary 564 (*)    All other components within normal limits  CBG MONITORING, ED - Abnormal; Notable for the following components:   Glucose-Capillary 411 (*)    All other components within normal limits  CBG MONITORING, ED - Abnormal; Notable for the following components:   Glucose-Capillary 340 (*)    All other components within normal limits  CBC WITH DIFFERENTIAL/PLATELET    EKG None  Radiology No results found.  Procedures Procedures (including critical care time)  Medications Ordered in ED Medications  lactated ringers bolus 2,000 mL (has no administration in time range)    ED Course  I have reviewed the triage vital signs and the nursing notes.  Pertinent labs & imaging results that were available during my care of the patient were reviewed by me and considered in my medical decision making (see chart for details).    MDM Rules/Calculators/A&P                          Patient presenting today with persistent hyperglycemia.  He is  well-appearing with no abdominal pain or vomiting and low suspicion for DKA.  Will treat with IV fluids and check labs.  Patient reports symptoms all started after his second Covid vaccine.  He denies any dietary changes, change in medications or noncompliance.  He has no infectious symptoms at this time.  9:32 PM CBC within normal limits and CMP with mild anion gap of 16.  However patient remains well-appearing.  He received 2 L of fluid and  10 units of insulin and now is blood sugar of 340.  He will take his nighttime insulin and call his doctor in the morning for recommendations on adjustments.  MDM Number of Diagnoses or Management Options   Amount and/or Complexity of Data Reviewed Clinical lab tests: ordered and reviewed Independent visualization of images, tracings, or specimens: yes  Risk of Complications, Morbidity, and/or Mortality Presenting problems: moderate Diagnostic procedures: low Management options: low  Patient Progress Patient progress: stable   Final Clinical Impression(s) / ED Diagnoses Final diagnoses:  Hyperglycemia    Rx / DC Orders ED Discharge Orders    None       Blanchie Dessert, MD 04/15/20 2134
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
## 2021-03-22 ENCOUNTER — Other Ambulatory Visit: Payer: Self-pay

## 2021-03-22 ENCOUNTER — Encounter (HOSPITAL_BASED_OUTPATIENT_CLINIC_OR_DEPARTMENT_OTHER): Payer: Self-pay | Admitting: *Deleted

## 2021-03-22 ENCOUNTER — Emergency Department (HOSPITAL_BASED_OUTPATIENT_CLINIC_OR_DEPARTMENT_OTHER)
Admission: EM | Admit: 2021-03-22 | Discharge: 2021-03-22 | Disposition: A | Discharge status: Home / Self Care | Payer: BC Managed Care – PPO | Attending: Emergency Medicine | Admitting: Emergency Medicine

## 2021-03-22 DIAGNOSIS — I10 Essential (primary) hypertension: Secondary | ICD-10-CM | POA: Insufficient documentation

## 2021-03-22 DIAGNOSIS — Z79899 Other long term (current) drug therapy: Secondary | ICD-10-CM | POA: Insufficient documentation

## 2021-03-22 DIAGNOSIS — R739 Hyperglycemia, unspecified: Secondary | ICD-10-CM | POA: Diagnosis present

## 2021-03-22 DIAGNOSIS — Z7984 Long term (current) use of oral hypoglycemic drugs: Secondary | ICD-10-CM | POA: Insufficient documentation

## 2021-03-22 DIAGNOSIS — E1165 Type 2 diabetes mellitus with hyperglycemia: Secondary | ICD-10-CM | POA: Diagnosis not present

## 2021-03-22 DIAGNOSIS — Z794 Long term (current) use of insulin: Secondary | ICD-10-CM | POA: Diagnosis not present

## 2021-03-22 HISTORY — DX: Essential (primary) hypertension: I10

## 2021-03-22 LAB — CBC WITH DIFFERENTIAL/PLATELET
Abs Immature Granulocytes: 0.01 10*3/uL (ref 0.00–0.07)
Basophils Absolute: 0 10*3/uL (ref 0.0–0.1)
Basophils Relative: 1 %
Eosinophils Absolute: 0.1 10*3/uL (ref 0.0–0.5)
Eosinophils Relative: 3 %
HCT: 42.4 % (ref 39.0–52.0)
Hemoglobin: 14.5 g/dL (ref 13.0–17.0)
Immature Granulocytes: 0 %
Lymphocytes Relative: 40 %
Lymphs Abs: 1.9 10*3/uL (ref 0.7–4.0)
MCH: 28.8 pg (ref 26.0–34.0)
MCHC: 34.2 g/dL (ref 30.0–36.0)
MCV: 84.3 fL (ref 80.0–100.0)
Monocytes Absolute: 0.7 10*3/uL (ref 0.1–1.0)
Monocytes Relative: 15 %
Neutro Abs: 2 10*3/uL (ref 1.7–7.7)
Neutrophils Relative %: 41 %
Platelets: 307 10*3/uL (ref 150–400)
RBC: 5.03 MIL/uL (ref 4.22–5.81)
RDW: 13.2 % (ref 11.5–15.5)
WBC: 4.8 10*3/uL (ref 4.0–10.5)
nRBC: 0 % (ref 0.0–0.2)

## 2021-03-22 LAB — I-STAT VENOUS BLOOD GAS, ED
Acid-Base Excess: 1 mmol/L (ref 0.0–2.0)
Bicarbonate: 25.4 mmol/L (ref 20.0–28.0)
Calcium, Ion: 1.14 mmol/L — ABNORMAL LOW (ref 1.15–1.40)
HCT: 45 % (ref 39.0–52.0)
Hemoglobin: 15.3 g/dL (ref 13.0–17.0)
O2 Saturation: 90 %
Patient temperature: 98.6
Potassium: 3.7 mmol/L (ref 3.5–5.1)
Sodium: 134 mmol/L — ABNORMAL LOW (ref 135–145)
TCO2: 27 mmol/L (ref 22–32)
pCO2, Ven: 40.2 mmHg — ABNORMAL LOW (ref 44.0–60.0)
pH, Ven: 7.409 (ref 7.250–7.430)
pO2, Ven: 58 mmHg — ABNORMAL HIGH (ref 32.0–45.0)

## 2021-03-22 LAB — URINALYSIS, ROUTINE W REFLEX MICROSCOPIC
Bilirubin Urine: NEGATIVE
Glucose, UA: >=500 mg/dL — AB
Hgb urine dipstick: NEGATIVE
Ketones, ur: NEGATIVE mg/dL
Leukocytes,Ua: NEGATIVE
Nitrite: NEGATIVE
Protein, ur: NEGATIVE mg/dL
Specific Gravity, Urine: 1.015 (ref 1.005–1.030)
pH: 5.5 (ref 5.0–8.0)

## 2021-03-22 LAB — BASIC METABOLIC PANEL
Anion gap: 11 (ref 5–15)
BUN: 9 mg/dL (ref 6–20)
CO2: 25 mmol/L (ref 22–32)
Calcium: 8.9 mg/dL (ref 8.9–10.3)
Chloride: 95 mmol/L — ABNORMAL LOW (ref 98–111)
Creatinine, Ser: 0.99 mg/dL (ref 0.61–1.24)
GFR, Estimated: >60 mL/min (ref >60)
Glucose, Bld: 429 mg/dL — ABNORMAL HIGH (ref 70–99)
Potassium: 3.6 mmol/L (ref 3.5–5.1)
Sodium: 131 mmol/L — ABNORMAL LOW (ref 135–145)

## 2021-03-22 LAB — CBG MONITORING, ED
Glucose-Capillary: 442 mg/dL — ABNORMAL HIGH (ref 70–99)
Glucose-Capillary: 394 mg/dL — ABNORMAL HIGH (ref 70–99)
Glucose-Capillary: 302 mg/dL — ABNORMAL HIGH (ref 70–99)

## 2021-03-22 LAB — URINALYSIS, MICROSCOPIC (REFLEX)

## 2021-03-22 MED ORDER — LEVEMIR FLEXTOUCH 100 UNIT/ML ~~LOC~~ SOPN
40.0000 [IU] | PEN_INJECTOR | Freq: Two times a day (BID) | SUBCUTANEOUS | 0 refills | Status: AC
Start: 2021-03-22 — End: 2021-04-21

## 2021-03-22 MED ORDER — INSULIN REGULAR HUMAN 100 UNIT/ML IJ SOLN: 10.0000 [IU] | Freq: Once | INTRAMUSCULAR | Status: DC

## 2021-03-22 MED ORDER — SODIUM CHLORIDE 0.9 % IV BOLUS
1000.0000 mL | Freq: Once | INTRAVENOUS | Status: AC
  Administered 2021-03-22: 1000 mL via INTRAVENOUS

## 2021-03-22 MED ORDER — INSULIN ASPART 100 UNIT/ML IJ SOLN
10.0000 [IU] | Freq: Once | INTRAMUSCULAR | Status: AC
  Administered 2021-03-22: 10 [IU] via INTRAVENOUS

## 2021-03-22 NOTE — ED Provider Notes (Signed)
[facility] EMERGENCY DEPARTMENT Provider Note   CSN: [id number] Arrival date & time: 03/22/21  0750     History Chief Complaint  Patient presents with   Hyperglycemia    Chad Vega is a 50 y.o. male.  Pt presents to the ED today with hyperglycemia.  Pt said he has been taking 35 units Levemir BID and Metformin 1000 mg bid.  His blood sugars have been in the 300s.  He said he was out of his insulin for a few months and was just started back on it about a month ago.  He has been taking it since then.  Pt said his bs was in the 400s this am, so he came in for eval.  Pt feels ok.      Past Medical History:  Diagnosis Date   Diabetes mellitus without complication ([place])    Hypertension     Patient Active Problem List   Diagnosis Date Noted   OSA (obstructive sleep apnea) 04/05/2018   Controlled type 2 diabetes mellitus without complication ([place]) 62/94/7654   Hyponatremia    Obesity, Class III, BMI 40-49.9 (morbid obesity) ([place])    Insulin dependent diabetes mellitus 09/28/2017   Diabetes ([place]) 09/26/2017   Essential hypertension 09/26/2017   Hyperglycemia    Hyperkalemia     History reviewed. No pertinent surgical history.     History reviewed. No pertinent family history.  Social History   Tobacco Use   Smoking status: Never   Smokeless tobacco: Never  Vaping Use   Vaping Use: Never used  Substance Use Topics   Alcohol use: No   Drug use: No    Home Medications Prior to Admission medications   Medication Sig Start Date End Date Taking? Authorizing Provider  blood glucose meter kit and supplies KIT Dispense based on patient and insurance preference. Use up to four times daily as directed. (FOR ICD-9 250.00, 250.01). 09/29/17   Florencia Reasons, MD  esomeprazole (NEXIUM) 40 MG capsule Take 40 mg by mouth daily. 09/15/17   [provider]  insulin detemir (LEVEMIR FLEXTOUCH) 100 UNIT/ML FlexPen Inject 40 Units into the skin 2 (two) times daily.  03/22/21 04/21/21  Isla Pence, MD  metFORMIN (GLUCOPHAGE) 1000 MG tablet Take 1,000 mg by mouth 2 (two) times daily. 02/05/20   [provider]  metFORMIN (GLUCOPHAGE) 500 MG tablet Take 2 tablets (1,000 mg total) by mouth 2 (two) times daily. 09/27/17   Florencia Reasons, MD  ondansetron (ZOFRAN ODT) 4 MG disintegrating tablet Take 1 tablet (4 mg total) by mouth every 8 (eight) hours as needed for nausea. 08/01/18   Antonietta Breach, PA-C  oseltamivir (TAMIFLU) 75 MG capsule Take 1 capsule (75 mg total) by mouth every 12 (twelve) hours. 08/01/18   Antonietta Breach, PA-C  telmisartan-hydrochlorothiazide (MICARDIS HCT) 80-25 MG tablet Take 1 tablet by mouth daily. 09/10/17   [provider]    Allergies    Patient has no known allergies.  Review of Systems   Review of Systems  All other systems reviewed and are negative.  Physical Exam Updated Vital Signs BP 119/78   Pulse 84   Temp 98.7 F (37.1 C) (Oral)   Resp 20   Ht 6' 2"  (1.88 m)   Wt (!) 163.7 kg   SpO2 100%   BMI 46.35 kg/m   Physical Exam Vitals and nursing note reviewed.  Constitutional:      Appearance: Normal appearance. He is obese.  HENT:     Head: Normocephalic  and atraumatic.     Right Ear: External ear normal.     Left Ear: External ear normal.     Nose: Nose normal.     Mouth/Throat:     Mouth: Mucous membranes are dry.  Eyes:     Extraocular Movements: Extraocular movements intact.     Conjunctiva/sclera: Conjunctivae normal.     Pupils: Pupils are equal, round, and reactive to light.  Cardiovascular:     Rate and Rhythm: Normal rate and regular rhythm.     Pulses: Normal pulses.     Heart sounds: Normal heart sounds.  Pulmonary:     Effort: Pulmonary effort is normal.     Breath sounds: Normal breath sounds.  Abdominal:     General: Abdomen is flat. Bowel sounds are normal.     Palpations: Abdomen is soft.  Musculoskeletal:        General: Normal range of motion.     Cervical back: Normal range  of motion and neck supple.  Skin:    General: Skin is warm.     Capillary Refill: Capillary refill takes less than 2 seconds.  Neurological:     General: No focal deficit present.     Mental Status: He is alert and oriented to person, place, and time.  Psychiatric:        Mood and Affect: Mood normal.        Behavior: Behavior normal.   ED Results / Procedures / Treatments   Labs (all labs ordered are listed, but only abnormal results are displayed) Labs Reviewed  BASIC METABOLIC PANEL - Abnormal; Notable for the following components:      Result Value   Sodium 131 (*)    Chloride 95 (*)    Glucose, Bld 429 (*)    All other components within normal limits  URINALYSIS, ROUTINE W REFLEX MICROSCOPIC - Abnormal; Notable for the following components:   Glucose, UA >=500 (*)    All other components within normal limits  URINALYSIS, MICROSCOPIC (REFLEX) - Abnormal; Notable for the following components:   Bacteria, UA RARE (*)    All other components within normal limits  CBG MONITORING, ED - Abnormal; Notable for the following components:   Glucose-Capillary 442 (*)    All other components within normal limits  I-STAT VENOUS BLOOD GAS, ED - Abnormal; Notable for the following components:   pCO2, Ven 40.2 (*)    pO2, Ven 58.0 (*)    Sodium 134 (*)    Calcium, Ion 1.14 (*)    All other components within normal limits  CBG MONITORING, ED - Abnormal; Notable for the following components:   Glucose-Capillary 394 (*)    All other components within normal limits  CBG MONITORING, ED - Abnormal; Notable for the following components:   Glucose-Capillary 302 (*)    All other components within normal limits  CBC WITH DIFFERENTIAL/PLATELET    EKG None  Radiology No results found.  Procedures Procedures   Medications Ordered in ED Medications  sodium chloride 0.9 % bolus 1,000 mL (0 mLs Intravenous Stopped 03/22/21 0912)  insulin aspart (novoLOG) injection 10 Units (10 Units  Intravenous Given 03/22/21 0818)  sodium chloride 0.9 % bolus 1,000 mL (0 mLs Intravenous Stopped 03/22/21 1053)  insulin aspart (novoLOG) injection 10 Units (10 Units Intravenous Given 03/22/21 1000)    ED Course  I have reviewed the triage vital signs and the nursing notes.  Pertinent labs & imaging results that were available during my care of  the patient were reviewed by me and considered in my medical decision making (see chart for details).    MDM Rules/Calculators/A&P                           PT is not in DKA. BS is down to 300.  Pt said he has another insulin pen that he can take prn.  He has not been doing that every time.  I will increase his Levemir to 40 units BID.  Pt is encouraged to eat a diabetic diet which he has not been doing.  He is also encouraged to exercise.  He is to f/u with his pcp.  Return if worse.   Final Clinical Impression(s) / ED Diagnoses Final diagnoses:  Poorly controlled type 2 diabetes mellitus ([place])    Rx / DC Orders ED Discharge Orders          Ordered    insulin detemir (LEVEMIR FLEXTOUCH) 100 UNIT/ML FlexPen  2 times daily        03/22/21 1121             Isla Pence, MD 03/22/21 1122

## 2021-03-22 NOTE — ED Notes (Signed)
ED Provider at bedside. 

## 2021-03-22 NOTE — Discharge Instructions (Addendum)
Increase Levemir to 40 units twice a day.

## 2021-03-22 NOTE — ED Triage Notes (Signed)
Presents with elevated blood sugar, does home cks BID

## 2021-03-22 NOTE — ED Notes (Signed)
States ckd CBG at home before breakfast, states 341mg /dl, ck again at work after coffee 424mg /dl

## 2021-03-22 NOTE — ED Notes (Signed)
CBG  IN EXAM ROOM - 442MG /DL

## 2022-05-23 DIAGNOSIS — E782 Mixed hyperlipidemia: Secondary | ICD-10-CM | POA: Diagnosis not present

## 2022-05-23 DIAGNOSIS — E559 Vitamin D deficiency, unspecified: Secondary | ICD-10-CM | POA: Diagnosis not present

## 2022-05-23 DIAGNOSIS — I10 Essential (primary) hypertension: Secondary | ICD-10-CM | POA: Diagnosis not present

## 2022-05-23 DIAGNOSIS — Z794 Long term (current) use of insulin: Secondary | ICD-10-CM | POA: Diagnosis not present

## 2022-05-23 DIAGNOSIS — E1165 Type 2 diabetes mellitus with hyperglycemia: Secondary | ICD-10-CM | POA: Diagnosis not present

## 2022-05-23 DIAGNOSIS — K219 Gastro-esophageal reflux disease without esophagitis: Secondary | ICD-10-CM | POA: Diagnosis not present

## 2022-06-01 DIAGNOSIS — I10 Essential (primary) hypertension: Secondary | ICD-10-CM | POA: Diagnosis not present

## 2022-06-01 DIAGNOSIS — E1165 Type 2 diabetes mellitus with hyperglycemia: Secondary | ICD-10-CM | POA: Diagnosis not present

## 2022-06-01 DIAGNOSIS — K219 Gastro-esophageal reflux disease without esophagitis: Secondary | ICD-10-CM | POA: Diagnosis not present

## 2022-06-01 DIAGNOSIS — Z794 Long term (current) use of insulin: Secondary | ICD-10-CM | POA: Diagnosis not present

## 2022-06-01 DIAGNOSIS — E782 Mixed hyperlipidemia: Secondary | ICD-10-CM | POA: Diagnosis not present

## 2022-06-01 DIAGNOSIS — E559 Vitamin D deficiency, unspecified: Secondary | ICD-10-CM | POA: Diagnosis not present

## 2022-08-24 DIAGNOSIS — E782 Mixed hyperlipidemia: Secondary | ICD-10-CM | POA: Diagnosis not present

## 2022-08-24 DIAGNOSIS — E559 Vitamin D deficiency, unspecified: Secondary | ICD-10-CM | POA: Diagnosis not present

## 2022-08-24 DIAGNOSIS — E1165 Type 2 diabetes mellitus with hyperglycemia: Secondary | ICD-10-CM | POA: Diagnosis not present

## 2022-08-24 DIAGNOSIS — K219 Gastro-esophageal reflux disease without esophagitis: Secondary | ICD-10-CM | POA: Diagnosis not present

## 2022-08-24 DIAGNOSIS — I10 Essential (primary) hypertension: Secondary | ICD-10-CM | POA: Diagnosis not present

## 2022-08-24 DIAGNOSIS — Z794 Long term (current) use of insulin: Secondary | ICD-10-CM | POA: Diagnosis not present

## 2022-10-19 DIAGNOSIS — E782 Mixed hyperlipidemia: Secondary | ICD-10-CM | POA: Diagnosis not present

## 2022-10-19 DIAGNOSIS — E559 Vitamin D deficiency, unspecified: Secondary | ICD-10-CM | POA: Diagnosis not present

## 2022-10-19 DIAGNOSIS — Z794 Long term (current) use of insulin: Secondary | ICD-10-CM | POA: Diagnosis not present

## 2022-10-19 DIAGNOSIS — K219 Gastro-esophageal reflux disease without esophagitis: Secondary | ICD-10-CM | POA: Diagnosis not present

## 2022-10-19 DIAGNOSIS — E1165 Type 2 diabetes mellitus with hyperglycemia: Secondary | ICD-10-CM | POA: Diagnosis not present

## 2022-10-19 DIAGNOSIS — I10 Essential (primary) hypertension: Secondary | ICD-10-CM | POA: Diagnosis not present

## 2023-02-15 DIAGNOSIS — Z125 Encounter for screening for malignant neoplasm of prostate: Secondary | ICD-10-CM | POA: Diagnosis not present

## 2023-02-15 DIAGNOSIS — E1165 Type 2 diabetes mellitus with hyperglycemia: Secondary | ICD-10-CM | POA: Diagnosis not present

## 2023-02-15 DIAGNOSIS — I10 Essential (primary) hypertension: Secondary | ICD-10-CM | POA: Diagnosis not present

## 2023-02-15 DIAGNOSIS — E559 Vitamin D deficiency, unspecified: Secondary | ICD-10-CM | POA: Diagnosis not present

## 2023-02-15 DIAGNOSIS — E782 Mixed hyperlipidemia: Secondary | ICD-10-CM | POA: Diagnosis not present

## 2023-02-15 DIAGNOSIS — Z794 Long term (current) use of insulin: Secondary | ICD-10-CM | POA: Diagnosis not present

## 2023-02-15 DIAGNOSIS — K219 Gastro-esophageal reflux disease without esophagitis: Secondary | ICD-10-CM | POA: Diagnosis not present

## 2023-02-15 DIAGNOSIS — Z0000 Encounter for general adult medical examination without abnormal findings: Secondary | ICD-10-CM | POA: Diagnosis not present

## 2023-02-15 DIAGNOSIS — Z113 Encounter for screening for infections with a predominantly sexual mode of transmission: Secondary | ICD-10-CM | POA: Diagnosis not present

## 2023-03-07 ENCOUNTER — Encounter: Payer: Self-pay | Admitting: Gastroenterology

## 2023-03-16 ENCOUNTER — Ambulatory Visit (AMBULATORY_SURGERY_CENTER): Payer: 59 | Admitting: *Deleted

## 2023-03-16 VITALS — Ht 74.0 in | Wt 370.0 lb

## 2023-03-16 DIAGNOSIS — Z1211 Encounter for screening for malignant neoplasm of colon: Secondary | ICD-10-CM

## 2023-03-16 MED ORDER — NA SULFATE-K SULFATE-MG SULF 17.5-3.13-1.6 GM/177ML PO SOLN: 1.0000 | Freq: Once | ORAL | 0 refills | Status: AC

## 2023-03-16 NOTE — Progress Notes (Signed)
Pt's name and DOB verified at the beginning of the pre-visit.  Pt denies any difficulty with ambulating,sitting, laying down or rolling side to side Gave both [facility] main # and MD on call # prior to instructions.  No egg or soy allergy known to patient  Patient denies ever being intubated Pt has no issues moving head neck or swallowing No FH of Malignant Hyperthermia Pt is not on diet pills Pt is not on home 02  Pt is not on blood thinners  Pt denies issues with constipation  Pt is not on dialysis Pt denise any abnormal heart rhythms  Pt denies any upcoming cardiac testing Pt encouraged to use to use Singlecare or Goodrx to reduce cost  Patient's chart reviewed by Cathlyn Parsons CNRA prior to pre-visit and patient appropriate for the [facility].  Pre-visit completed and red dot placed by patient's name on their procedure day (on provider's schedule).  . Visit in person Pt scale weight is 370 lb Instructed pt why it is important to and  to call if they have any changes in health or new medications. Directed them to the # given and on instructions.   Pt states they will.  Instructions reviewed with pt and pt states understanding. Instructed to review again prior to procedure. Pt states they will.  Instructions given to pt with coupon and by my chart

## 2023-03-17 ENCOUNTER — Encounter: Payer: Self-pay | Admitting: Gastroenterology

## 2023-03-24 ENCOUNTER — Ambulatory Visit (AMBULATORY_SURGERY_CENTER): Payer: 59 | Admitting: Gastroenterology

## 2023-03-24 ENCOUNTER — Encounter: Payer: Self-pay | Admitting: Gastroenterology

## 2023-03-24 VITALS — BP 119/91 | HR 95 | Temp 97.3°F | Resp 21 | Ht 74.0 in | Wt 370.0 lb

## 2023-03-24 DIAGNOSIS — E119 Type 2 diabetes mellitus without complications: Secondary | ICD-10-CM | POA: Diagnosis not present

## 2023-03-24 DIAGNOSIS — G4730 Sleep apnea, unspecified: Secondary | ICD-10-CM | POA: Diagnosis not present

## 2023-03-24 DIAGNOSIS — Z1211 Encounter for screening for malignant neoplasm of colon: Secondary | ICD-10-CM

## 2023-03-24 DIAGNOSIS — I10 Essential (primary) hypertension: Secondary | ICD-10-CM | POA: Diagnosis not present

## 2023-03-24 MED ORDER — SODIUM CHLORIDE 0.9 % IV SOLN: 500.0000 mL | Freq: Once | INTRAVENOUS | Status: DC

## 2023-03-24 NOTE — Progress Notes (Signed)
Uneventful anesthetic. Report to pacu rn. Vss on [place] O2. Care resumed by rn.

## 2023-03-24 NOTE — Progress Notes (Signed)
Pt's states no medical or surgical changes since previsit or office visit. 

## 2023-03-24 NOTE — Patient Instructions (Signed)
Handout provided on hemorrhoids.  Resume previous diet.  Continue present medications.  Repeat colonoscopy in 5 years for screening purposes. Due to quality of prep with 2-day prep. Earlier, if with any new problems or change in family history.   YOU HAD AN ENDOSCOPIC PROCEDURE TODAY AT THE  ENDOSCOPY CENTER:   Refer to the procedure report that was given to you for any specific questions about what was found during the examination.  If the procedure report does not answer your questions, please call your gastroenterologist to clarify.  If you requested that your care partner not be given the details of your procedure findings, then the procedure report has been included in a sealed envelope for you to review at your convenience later.  YOU SHOULD EXPECT: Some feelings of bloating in the abdomen. Passage of more gas than usual.  Walking can help get rid of the air that was put into your GI tract during the procedure and reduce the bloating. If you had a lower endoscopy (such as a colonoscopy or flexible sigmoidoscopy) you may notice spotting of blood in your stool or on the toilet paper. If you underwent a bowel prep for your procedure, you may not have a normal bowel movement for a few days.  Please Note:  You might notice some irritation and congestion in your nose or some drainage.  This is from the oxygen used during your procedure.  There is no need for concern and it should clear up in a day or so.  SYMPTOMS TO REPORT IMMEDIATELY:  Following lower endoscopy (colonoscopy or flexible sigmoidoscopy):  Excessive amounts of blood in the stool  Significant tenderness or worsening of abdominal pains  Swelling of the abdomen that is new, acute  Fever of 100F or higher  For urgent or emergent issues, a gastroenterologist can be reached at any hour by calling [phone]. Do not use MyChart messaging for urgent concerns.    DIET:  We do recommend a small meal at first, but then you may  proceed to your regular diet.  Drink plenty of fluids but you should avoid alcoholic beverages for 24 hours.  ACTIVITY:  You should plan to take it easy for the rest of today and you should NOT DRIVE or use heavy machinery until tomorrow (because of the sedation medicines used during the test).    FOLLOW UP: Our staff will call the number listed on your records the next business day following your procedure.  We will call around 7:15- 8:00 am to check on you and address any questions or concerns that you may have regarding the information given to you following your procedure. If we do not reach you, we will leave a message.     If any biopsies were taken you will be contacted by phone or by letter within the next 1-3 weeks.  Please call us at [phone] if you have not heard about the biopsies in 3 weeks.    SIGNATURES/CONFIDENTIALITY: You and/or your care partner have signed paperwork which will be entered into your electronic medical record.  These signatures attest to the fact that that the information above on your After Visit Summary has been reviewed and is understood.  Full responsibility of the confidentiality of this discharge information lies with you and/or your care-partner.

## 2023-03-24 NOTE — Op Note (Signed)
[facility] Patient Name: Chad Vega Procedure Date: 03/24/2023 11:25 AM MRN: [id number] Endoscopist: Lynann Bologna , MD, [id number] Age: 52 Referring MD:  Date of Birth: 1971/03/09 Gender: Male Account #: [id number] Procedure:                Colonoscopy Indications:              Screening for colorectal malignant neoplasm Medicines:                Monitored Anesthesia Care Procedure:                Pre-Anesthesia Assessment:                           - Prior to the procedure, a History and Physical                            was performed, and patient medications and                            allergies were reviewed. The patient's tolerance of                            previous anesthesia was also reviewed. The risks                            and benefits of the procedure and the sedation                            options and risks were discussed with the patient.                            All questions were answered, and informed consent                            was obtained. Prior Anticoagulants: The patient has                            taken no anticoagulant or antiplatelet agents. ASA                            Grade Assessment: II - A patient with mild systemic                            disease. After reviewing the risks and benefits,                            the patient was deemed in satisfactory condition to                            undergo the procedure.                           After obtaining informed consent, the colonoscope  was passed under direct vision. Throughout the                            procedure, the patient's blood pressure, pulse, and                            oxygen saturations were monitored continuously. The                            CF [id number] #[id number] was introduced through the anus                            and advanced to the the cecum, identified by                            appendiceal orifice  and ileocecal valve. The                            colonoscopy was somewhat difficult due to the                            patient's body habitus and quality of prep. Some                            retained stool specially in the right side of the                            colon making it somewhat harder to visualize.                            Successful completion of the procedure was aided by                            applying abdominal pressure. The patient tolerated                            the procedure well. The quality of the bowel                            preparation was adequate to identify polyps. The                            ileocecal valve, appendiceal orifice, and rectum                            were photographed. The colon was somewhat redundant                            and difficult to examine. Scope In: 11:38:35 AM Scope Out: 11:59:11 AM Scope Withdrawal Time: 0 hours 13 minutes 2 seconds  Total Procedure Duration: 0 hours 20 minutes 35 seconds  Findings:                 The colon (entire  examined portion) appeared normal.                           Non-bleeding internal hemorrhoids were found during                            retroflexion. The hemorrhoids were small and Grade                            I (internal hemorrhoids that do not prolapse).                           The exam was otherwise without abnormality on                            direct and retroflexion views. Complications:            No immediate complications. Estimated Blood Loss:     Estimated blood loss: none. Impression:               - The entire examined colon is normal.                           - Non-bleeding internal hemorrhoids.                           - The examination was otherwise normal on direct                            and retroflexion views.                           - No specimens collected. Recommendation:           - Patient has a contact number available for                             emergencies. The signs and symptoms of potential                            delayed complications were discussed with the                            patient. Return to normal activities tomorrow.                            Written discharge instructions were provided to the                            patient.                           - Resume previous diet.                           - Continue present medications.                           -  Repeat colonoscopy in 5 years for screening                            purposes d/t quality of prep with 2-day prep.                            Earlier, if with any new problems or change in                            family history                           - The findings and recommendations were discussed                            with the patient's family. Lynann Bologna, MD 03/24/2023 12:02:23 PM This report has been signed electronically.

## 2023-03-24 NOTE — Progress Notes (Signed)
Gastroenterology History and Physical   Primary Care Physician:  Norm Salt, PA   Reason for Procedure:   CRC Screening  Plan:     colonoscopy     HPI: Chad Vega is a 52 y.o. male    Past Medical History:  Diagnosis Date   Diabetes mellitus without complication (HCC)    Hypertension    Sleep apnea     Past Surgical History:  Procedure Laterality Date   COLONOSCOPY      Prior to Admission medications   Medication Sig Start Date End Date Taking? Authorizing Provider  blood glucose meter kit and supplies KIT Dispense based on patient and insurance preference. Use up to four times daily as directed. (FOR ICD-9 250.00, 250.01). 09/29/17  Yes Albertine Grates, MD  Continuous Glucose Sensor (DEXCOM G6 SENSOR) MISC SMARTSIG:1 Each Topical Every 10 Days 03/15/23  Yes [provider]  Continuous Glucose Transmitter (DEXCOM G6 TRANSMITTER) MISC USE AS DIRECTED CONTINUOUS GLUCOSE MONITORING 12/11/22  Yes [provider]  esomeprazole (NEXIUM) 40 MG capsule Take 40 mg by mouth daily. As needed 09/15/17  Yes [provider]  glipiZIDE (GLUCOTROL) 10 MG tablet Take 10 mg by mouth 2 (two) times daily. 02/25/23  Yes [provider]  insulin detemir (LEVEMIR) 100 UNIT/ML injection Inject 50 Units into the skin daily.   Yes [provider]  metFORMIN (GLUCOPHAGE) 500 MG tablet Take 2 tablets (1,000 mg total) by mouth 2 (two) times daily. 09/27/17  Yes Albertine Grates, MD  telmisartan-hydrochlorothiazide (MICARDIS HCT) 80-25 MG tablet Take 1 tablet by mouth daily. 09/10/17  Yes [provider]  insulin detemir (LEVEMIR FLEXTOUCH) 100 UNIT/ML FlexPen Inject 40 Units into the skin 2 (two) times daily. 03/22/21 04/21/21  Jacalyn Lefevre, MD    Current Outpatient Medications  Medication Sig Dispense Refill   blood glucose meter kit and supplies KIT Dispense based on patient and insurance preference. Use up to four times daily as directed. (FOR ICD-9  250.00, 250.01). 1 each 0   Continuous Glucose Sensor (DEXCOM G6 SENSOR) MISC SMARTSIG:1 Each Topical Every 10 Days     Continuous Glucose Transmitter (DEXCOM G6 TRANSMITTER) MISC USE AS DIRECTED CONTINUOUS GLUCOSE MONITORING     esomeprazole (NEXIUM) 40 MG capsule Take 40 mg by mouth daily. As needed  3   glipiZIDE (GLUCOTROL) 10 MG tablet Take 10 mg by mouth 2 (two) times daily.     insulin detemir (LEVEMIR) 100 UNIT/ML injection Inject 50 Units into the skin daily.     metFORMIN (GLUCOPHAGE) 500 MG tablet Take 2 tablets (1,000 mg total) by mouth 2 (two) times daily. 60 tablet 1   telmisartan-hydrochlorothiazide (MICARDIS HCT) 80-25 MG tablet Take 1 tablet by mouth daily.  3   insulin detemir (LEVEMIR FLEXTOUCH) 100 UNIT/ML FlexPen Inject 40 Units into the skin 2 (two) times daily. 24 mL 0   Current Facility-Administered Medications  Medication Dose Route Frequency Provider Last Rate Last Admin   0.9 %  sodium chloride infusion  500 mL Intravenous Once Lynann Bologna, MD        Allergies as of 03/24/2023   (No Known Allergies)    Family History  Problem Relation Age of Onset   Colon cancer Neg Hx    Colon polyps Neg Hx    Esophageal cancer Neg Hx    Rectal cancer Neg Hx    Stomach cancer Neg Hx     Social History   Socioeconomic History   Marital status: Single  Spouse name: Not on file   Number of children: Not on file   Years of education: Not on file   Highest education level: Not on file  Occupational History   Not on file  Tobacco Use   Smoking status: Never   Smokeless tobacco: Never  Vaping Use   Vaping status: Never Used  Substance and Sexual Activity   Alcohol use: No   Drug use: No   Sexual activity: Yes    Partners: Female    Birth control/protection: None  Other Topics Concern   Not on file  Social History Narrative   Not on file   Social Determinants of Health   Financial Resource Strain: Low Risk  (09/26/2017)   Overall Financial Resource Strain  (CARDIA)    Difficulty of Paying Living Expenses: Not hard at all  Food Insecurity: No Food Insecurity (09/26/2017)   Hunger Vital Sign    Worried About Running Out of Food in the Last Year: Never true    Ran Out of Food in the Last Year: Never true  Transportation Needs: No Transportation Needs (09/26/2017)   PRAPARE - Administrator, Civil Service (Medical): No    Lack of Transportation (Non-Medical): No  Physical Activity: Unknown (09/26/2017)   Exercise Vital Sign    Days of Exercise per Week: Patient declined    Minutes of Exercise per Session: Patient declined  Stress: No Stress Concern Present (09/26/2017)   [employer] of Occupational Health - Occupational Stress Questionnaire    Feeling of Stress : Not at all  Social Connections: Moderately Integrated (09/26/2017)   Social Connection and Isolation Panel [NHANES]    Frequency of Communication with Friends and Family: Once a week    Frequency of Social Gatherings with Friends and Family: Once a week    Attends Religious Services: More than 4 times per year    Active Member of [employer] or Organizations: Yes    Attends Banker Meetings: Never    Marital Status: Living with partner  Intimate Partner Violence: Not At Risk (09/26/2017)   Humiliation, Afraid, Rape, and Kick questionnaire    Fear of Current or Ex-Partner: No    Emotionally Abused: No    Physically Abused: No    Sexually Abused: No    Review of Systems: Positive for none All other review of systems negative except as mentioned in the HPI.  Physical Exam: Vital signs in last 24 hours: @VSRANGES @   General:   Alert,  Well-developed, well-nourished, pleasant and cooperative in NAD Lungs:  Clear throughout to auscultation.   Heart:  Regular rate and rhythm; no murmurs, clicks, rubs,  or gallops. Abdomen:  Soft, nontender and nondistended. Normal bowel sounds.   Neuro/Psych:  Alert and cooperative. Normal mood and affect. A and O x 3    No  significant changes were identified.  The patient continues to be an appropriate candidate for the planned procedure and anesthesia.   Edman Circle, MD. [facility] 03/24/2023 11:28 AM@

## 2023-03-27 ENCOUNTER — Telehealth: Payer: Self-pay | Admitting: *Deleted

## 2023-03-27 NOTE — Telephone Encounter (Signed)
  Follow up Call-     03/24/2023   10:50 AM  Call back number  Post procedure Call Back phone  # [phone]  Permission to leave phone message Yes     Patient questions:  Do you have a fever, pain , or abdominal swelling? No. Pain Score  0 *  Have you tolerated food without any problems? Yes.    Have you been able to return to your normal activities? Yes.    Do you have any questions about your discharge instructions: Diet   No. Medications  No. Follow up visit  No.  Do you have questions or concerns about your Care? No.  Actions: * If pain score is 4 or above: No action needed, pain <4.

## 2023-07-11 DIAGNOSIS — K219 Gastro-esophageal reflux disease without esophagitis: Secondary | ICD-10-CM | POA: Diagnosis not present

## 2023-07-11 DIAGNOSIS — Z794 Long term (current) use of insulin: Secondary | ICD-10-CM | POA: Diagnosis not present

## 2023-07-11 DIAGNOSIS — I10 Essential (primary) hypertension: Secondary | ICD-10-CM | POA: Diagnosis not present

## 2023-07-11 DIAGNOSIS — E782 Mixed hyperlipidemia: Secondary | ICD-10-CM | POA: Diagnosis not present

## 2023-07-11 DIAGNOSIS — E1165 Type 2 diabetes mellitus with hyperglycemia: Secondary | ICD-10-CM | POA: Diagnosis not present

## 2023-07-11 DIAGNOSIS — E559 Vitamin D deficiency, unspecified: Secondary | ICD-10-CM | POA: Diagnosis not present

## 2023-07-25 DIAGNOSIS — Z794 Long term (current) use of insulin: Secondary | ICD-10-CM | POA: Diagnosis not present

## 2023-07-25 DIAGNOSIS — E1165 Type 2 diabetes mellitus with hyperglycemia: Secondary | ICD-10-CM | POA: Diagnosis not present

## 2023-07-25 DIAGNOSIS — E782 Mixed hyperlipidemia: Secondary | ICD-10-CM | POA: Diagnosis not present

## 2023-07-25 DIAGNOSIS — I10 Essential (primary) hypertension: Secondary | ICD-10-CM | POA: Diagnosis not present

## 2023-07-25 DIAGNOSIS — K219 Gastro-esophageal reflux disease without esophagitis: Secondary | ICD-10-CM | POA: Diagnosis not present

## 2023-07-25 DIAGNOSIS — E559 Vitamin D deficiency, unspecified: Secondary | ICD-10-CM | POA: Diagnosis not present

## 2023-07-30 DIAGNOSIS — Z008 Encounter for other general examination: Secondary | ICD-10-CM | POA: Diagnosis not present

## 2023-08-24 DIAGNOSIS — K219 Gastro-esophageal reflux disease without esophagitis: Secondary | ICD-10-CM | POA: Diagnosis not present

## 2023-08-24 DIAGNOSIS — I10 Essential (primary) hypertension: Secondary | ICD-10-CM | POA: Diagnosis not present

## 2023-08-24 DIAGNOSIS — E782 Mixed hyperlipidemia: Secondary | ICD-10-CM | POA: Diagnosis not present

## 2023-08-24 DIAGNOSIS — E559 Vitamin D deficiency, unspecified: Secondary | ICD-10-CM | POA: Diagnosis not present

## 2023-08-24 DIAGNOSIS — E1165 Type 2 diabetes mellitus with hyperglycemia: Secondary | ICD-10-CM | POA: Diagnosis not present

## 2023-08-24 DIAGNOSIS — Z794 Long term (current) use of insulin: Secondary | ICD-10-CM | POA: Diagnosis not present

## 2023-10-23 DIAGNOSIS — Z794 Long term (current) use of insulin: Secondary | ICD-10-CM | POA: Diagnosis not present

## 2023-10-23 DIAGNOSIS — E559 Vitamin D deficiency, unspecified: Secondary | ICD-10-CM | POA: Diagnosis not present

## 2023-10-23 DIAGNOSIS — K219 Gastro-esophageal reflux disease without esophagitis: Secondary | ICD-10-CM | POA: Diagnosis not present

## 2023-10-23 DIAGNOSIS — I10 Essential (primary) hypertension: Secondary | ICD-10-CM | POA: Diagnosis not present

## 2023-10-23 DIAGNOSIS — E1165 Type 2 diabetes mellitus with hyperglycemia: Secondary | ICD-10-CM | POA: Diagnosis not present

## 2023-10-23 DIAGNOSIS — E782 Mixed hyperlipidemia: Secondary | ICD-10-CM | POA: Diagnosis not present

## 2024-01-07 DIAGNOSIS — E1122 Type 2 diabetes mellitus with diabetic chronic kidney disease: Secondary | ICD-10-CM | POA: Diagnosis not present

## 2024-01-07 DIAGNOSIS — Z794 Long term (current) use of insulin: Secondary | ICD-10-CM | POA: Diagnosis not present

## 2024-01-07 DIAGNOSIS — K219 Gastro-esophageal reflux disease without esophagitis: Secondary | ICD-10-CM | POA: Diagnosis not present

## 2024-01-07 DIAGNOSIS — G4730 Sleep apnea, unspecified: Secondary | ICD-10-CM | POA: Diagnosis not present

## 2024-01-07 DIAGNOSIS — N181 Chronic kidney disease, stage 1: Secondary | ICD-10-CM | POA: Diagnosis not present

## 2024-01-07 DIAGNOSIS — Z6841 Body Mass Index (BMI) 40.0 and over, adult: Secondary | ICD-10-CM | POA: Diagnosis not present

## 2024-01-07 DIAGNOSIS — I129 Hypertensive chronic kidney disease with stage 1 through stage 4 chronic kidney disease, or unspecified chronic kidney disease: Secondary | ICD-10-CM | POA: Diagnosis not present

## 2024-01-07 DIAGNOSIS — Z7984 Long term (current) use of oral hypoglycemic drugs: Secondary | ICD-10-CM | POA: Diagnosis not present

## 2024-01-07 DIAGNOSIS — Z833 Family history of diabetes mellitus: Secondary | ICD-10-CM | POA: Diagnosis not present

## 2024-01-07 DIAGNOSIS — Z008 Encounter for other general examination: Secondary | ICD-10-CM | POA: Diagnosis not present

## 2024-01-07 DIAGNOSIS — E785 Hyperlipidemia, unspecified: Secondary | ICD-10-CM | POA: Diagnosis not present

## 2024-03-15 ENCOUNTER — Encounter (HOSPITAL_BASED_OUTPATIENT_CLINIC_OR_DEPARTMENT_OTHER): Payer: Self-pay | Admitting: Emergency Medicine

## 2024-03-15 ENCOUNTER — Emergency Department (HOSPITAL_BASED_OUTPATIENT_CLINIC_OR_DEPARTMENT_OTHER)
Admission: EM | Admit: 2024-03-15 | Discharge: 2024-03-16 | Disposition: A | Discharge status: Home / Self Care | Attending: Emergency Medicine | Admitting: Emergency Medicine

## 2024-03-15 ENCOUNTER — Other Ambulatory Visit: Payer: Self-pay

## 2024-03-15 DIAGNOSIS — R000 Tachycardia, unspecified: Secondary | ICD-10-CM | POA: Diagnosis not present

## 2024-03-15 DIAGNOSIS — E559 Vitamin D deficiency, unspecified: Secondary | ICD-10-CM | POA: Diagnosis not present

## 2024-03-15 DIAGNOSIS — K219 Gastro-esophageal reflux disease without esophagitis: Secondary | ICD-10-CM | POA: Diagnosis not present

## 2024-03-15 DIAGNOSIS — Z7984 Long term (current) use of oral hypoglycemic drugs: Secondary | ICD-10-CM | POA: Insufficient documentation

## 2024-03-15 DIAGNOSIS — E1165 Type 2 diabetes mellitus with hyperglycemia: Secondary | ICD-10-CM | POA: Diagnosis not present

## 2024-03-15 DIAGNOSIS — R739 Hyperglycemia, unspecified: Secondary | ICD-10-CM

## 2024-03-15 DIAGNOSIS — I10 Essential (primary) hypertension: Secondary | ICD-10-CM | POA: Diagnosis not present

## 2024-03-15 DIAGNOSIS — E119 Type 2 diabetes mellitus without complications: Secondary | ICD-10-CM

## 2024-03-15 DIAGNOSIS — Z794 Long term (current) use of insulin: Secondary | ICD-10-CM | POA: Diagnosis not present

## 2024-03-15 DIAGNOSIS — E782 Mixed hyperlipidemia: Secondary | ICD-10-CM | POA: Diagnosis not present

## 2024-03-15 LAB — BASIC METABOLIC PANEL WITH GFR
Anion gap: 15 (ref 5–15)
BUN: 12 mg/dL (ref 6–20)
CO2: 22 mmol/L (ref 22–32)
Calcium: 9.4 mg/dL (ref 8.9–10.3)
Chloride: 94 mmol/L — ABNORMAL LOW (ref 98–111)
Creatinine, Ser: 1.09 mg/dL (ref 0.61–1.24)
GFR, Estimated: >60 mL/min (ref >60)
Glucose, Bld: 568 mg/dL (ref 70–99)
Potassium: 4.3 mmol/L (ref 3.5–5.1)
Sodium: 132 mmol/L — ABNORMAL LOW (ref 135–145)

## 2024-03-15 LAB — CBG MONITORING, ED
Glucose-Capillary: 302 mg/dL — ABNORMAL HIGH (ref 70–99)
Glucose-Capillary: 380 mg/dL — ABNORMAL HIGH (ref 70–99)
Glucose-Capillary: 424 mg/dL — ABNORMAL HIGH (ref 70–99)
Glucose-Capillary: 491 mg/dL — ABNORMAL HIGH (ref 70–99)
Glucose-Capillary: 533 mg/dL (ref 70–99)

## 2024-03-15 LAB — URINALYSIS, ROUTINE W REFLEX MICROSCOPIC
Bilirubin Urine: NEGATIVE
Glucose, UA: >=500 mg/dL — AB
Hgb urine dipstick: NEGATIVE
Ketones, ur: NEGATIVE mg/dL
Leukocytes,Ua: NEGATIVE
Nitrite: NEGATIVE
Protein, ur: NEGATIVE mg/dL
Specific Gravity, Urine: 1.01 (ref 1.005–1.030)
pH: 5.5 (ref 5.0–8.0)

## 2024-03-15 LAB — I-STAT VENOUS BLOOD GAS, ED
Acid-Base Excess: 0 mmol/L (ref 0.0–2.0)
Bicarbonate: 24.6 mmol/L (ref 20.0–28.0)
Calcium, Ion: 1.2 mmol/L (ref 1.15–1.40)
HCT: 44 % (ref 39.0–52.0)
Hemoglobin: 15 g/dL (ref 13.0–17.0)
O2 Saturation: 94 %
Patient temperature: 98.2
Potassium: 4.2 mmol/L (ref 3.5–5.1)
Sodium: 132 mmol/L — ABNORMAL LOW (ref 135–145)
TCO2: 26 mmol/L (ref 22–32)
pCO2, Ven: 40.2 mmHg — ABNORMAL LOW (ref 44–60)
pH, Ven: 7.394 (ref 7.25–7.43)
pO2, Ven: 70 mmHg — ABNORMAL HIGH (ref 32–45)

## 2024-03-15 LAB — URINALYSIS, MICROSCOPIC (REFLEX)
RBC / HPF: NONE SEEN RBC/hpf (ref 0–5)
WBC, UA: NONE SEEN WBC/hpf (ref 0–5)

## 2024-03-15 LAB — CBC
HCT: 40.7 % (ref 39.0–52.0)
Hemoglobin: 14 g/dL (ref 13.0–17.0)
MCH: 28.2 pg (ref 26.0–34.0)
MCHC: 34.4 g/dL (ref 30.0–36.0)
MCV: 82.1 fL (ref 80.0–100.0)
Platelets: 326 K/uL (ref 150–400)
RBC: 4.96 MIL/uL (ref 4.22–5.81)
RDW: 13.4 % (ref 11.5–15.5)
WBC: 6.2 K/uL (ref 4.0–10.5)
nRBC: 0 % (ref 0.0–0.2)

## 2024-03-15 MED ORDER — DEXTROSE 50 % IV SOLN: 0.0000 mL | INTRAVENOUS | Status: DC | PRN

## 2024-03-15 MED ORDER — DEXTROSE IN LACTATED RINGERS 5 % IV SOLN: INTRAVENOUS | Status: DC

## 2024-03-15 MED ORDER — LACTATED RINGERS IV SOLN: INTRAVENOUS | Status: DC

## 2024-03-15 MED ORDER — INSULIN REGULAR(HUMAN) IN NACL 100-0.9 UT/100ML-% IV SOLN
INTRAVENOUS | Status: DC
  Administered 2024-03-15: 18 [IU]/h via INTRAVENOUS
  Filled 2024-03-15: qty 100

## 2024-03-15 MED ORDER — INSULIN REGULAR HUMAN 100 UNIT/ML IJ SOLN: 10.0000 [IU] | Freq: Once | INTRAMUSCULAR | Status: DC

## 2024-03-15 MED ORDER — INSULIN ASPART 100 UNIT/ML IJ SOLN
10.0000 [IU] | Freq: Once | INTRAMUSCULAR | Status: AC
  Administered 2024-03-15: 10 [IU] via SUBCUTANEOUS

## 2024-03-15 MED ORDER — SODIUM CHLORIDE 0.9 % IV BOLUS
1000.0000 mL | Freq: Once | INTRAVENOUS | Status: AC
  Administered 2024-03-15: 1000 mL via INTRAVENOUS

## 2024-03-15 NOTE — ED Triage Notes (Signed)
 Pt POv c/o elevated blood sugar x1 week.  Pt reports running out insulin . Denies n/v/shob.  Reports he does have increased urinary frequency x 1 week.

## 2024-03-15 NOTE — ED Provider Notes (Signed)
 [facility] Provider Note   CSN: [id number] Arrival date & time: 03/15/24  1839     Patient presents with: Hyperglycemia   Chad Vega is a 53 y.o. male with a past medical history of insulin -dependent diabetes, HTN, BMI 45, OSA presents to emergency department for evaluation of polyuria, hyperglycemia.  Reports that he has been out of insulin  for the past week and his PCP normally refills it.    Today, he attempted to get his insulin  but his insurance would not cover it so he was unable to afford it.  He sought ED evaluation for sugar control.  Denies urinary symptoms other than frequency, NVD, abdominal pain, fevers   {Add pertinent medical, surgical, social history, OB history to HPI:[id number]}  Hyperglycemia      Prior to Admission medications   Medication Sig Start Date End Date Taking? Authorizing Provider  insulin  degludec (TRESIBA  FLEXTOUCH) 100 UNIT/ML FlexTouch Pen Inject 30 Units into the skin at bedtime. 03/16/24  Yes Minnie Tinnie BRAVO, PA  blood glucose meter kit and supplies KIT Dispense based on patient and insurance preference. Use up to four times daily as directed. (FOR ICD-9 250.00, 250.01). 09/29/17   Jerri Keys, MD  Continuous Glucose Sensor (DEXCOM G6 SENSOR) MISC SMARTSIG:1 Each Topical Every 10 Days 03/15/23   [provider]  Continuous Glucose Transmitter (DEXCOM G6 TRANSMITTER) MISC USE AS DIRECTED CONTINUOUS GLUCOSE MONITORING 12/11/22   [provider]  esomeprazole (NEXIUM) 40 MG capsule Take 40 mg by mouth daily. As needed 09/15/17   [provider]  glipiZIDE (GLUCOTROL) 10 MG tablet Take 10 mg by mouth 2 (two) times daily. 02/25/23   [provider]  insulin  detemir (LEVEMIR  FLEXTOUCH) 100 UNIT/ML FlexPen Inject 40 Units into the skin 2 (two) times daily. 03/22/21 04/21/21  Haviland, Julie, MD  insulin  detemir (LEVEMIR ) 100 UNIT/ML injection Inject 50 Units into the skin daily.     [provider]  metFORMIN  (GLUCOPHAGE ) 500 MG tablet Take 2 tablets (1,000 mg total) by mouth 2 (two) times daily. 09/27/17   Jerri Keys, MD  telmisartan-hydrochlorothiazide (MICARDIS HCT) 80-25 MG tablet Take 1 tablet by mouth daily. 09/10/17   [provider]    Allergies: Patient has no known allergies.    Review of Systems  Genitourinary:  Positive for frequency.    Updated Vital Signs BP 119/68   Pulse 91   Temp 98.1 F (36.7 C)   Resp 18   Ht 6' 2 (1.88 m)   Wt (!) 157.9 kg   SpO2 100%   BMI 44.68 kg/m   Physical Exam Vitals and nursing note reviewed.  Constitutional:      General: He is not in acute distress.    Appearance: Normal appearance.  HENT:     Head: Normocephalic and atraumatic.  Eyes:     Conjunctiva/sclera: Conjunctivae normal.  Cardiovascular:     Rate and Rhythm: Tachycardia present.     Comments: Mild tachycardia 107 bpm that improved to 91bpm following IVF Pulmonary:     Effort: Pulmonary effort is normal. No respiratory distress.  Abdominal:     General: Bowel sounds are normal. There is no distension.     Palpations: Abdomen is soft.     Tenderness: There is no abdominal tenderness. There is no guarding or rebound.     Comments: Nonsurgical abdomen with no peritoneal signs  Skin:    General: Skin is warm.     Coloration: Skin is  not jaundiced or pale.  Neurological:     Mental Status: He is alert and oriented to person, place, and time. Mental status is at baseline.     (all labs ordered are listed, but only abnormal results are displayed) Labs Reviewed  URINALYSIS, ROUTINE W REFLEX MICROSCOPIC - Abnormal; Notable for the following components:      Result Value   Glucose, UA >=500 (*)    All other components within normal limits  BASIC METABOLIC PANEL WITH GFR - Abnormal; Notable for the following components:   Sodium 132 (*)    Chloride 94 (*)    Glucose, Bld 568 (*)    All other components within normal limits   URINALYSIS, MICROSCOPIC (REFLEX) - Abnormal; Notable for the following components:   Bacteria, UA RARE (*)    All other components within normal limits  CBG MONITORING, ED - Abnormal; Notable for the following components:   Glucose-Capillary 491 (*)    All other components within normal limits  I-STAT VENOUS BLOOD GAS, ED - Abnormal; Notable for the following components:   pCO2, Ven 40.2 (*)    pO2, Ven 70 (*)    Sodium 132 (*)    All other components within normal limits  CBG MONITORING, ED - Abnormal; Notable for the following components:   Glucose-Capillary 533 (*)    All other components within normal limits  CBG MONITORING, ED - Abnormal; Notable for the following components:   Glucose-Capillary 424 (*)    All other components within normal limits  CBG MONITORING, ED - Abnormal; Notable for the following components:   Glucose-Capillary 380 (*)    All other components within normal limits  CBG MONITORING, ED - Abnormal; Notable for the following components:   Glucose-Capillary 302 (*)    All other components within normal limits  CBC    EKG: None  Radiology: No results found.   Medications Ordered in the ED  insulin  regular, human (MYXREDLIN ) 100 units/ 100 mL infusion (11 Units/hr Intravenous Rate/Dose Change 03/15/24 2345)  lactated ringers  infusion ( Intravenous New Bag/Given 03/15/24 2212)  dextrose  5 % in lactated ringers  infusion (has no administration in time range)  dextrose  50 % solution 0-50 mL (has no administration in time range)  sodium chloride  0.9 % bolus 1,000 mL (0 mLs Intravenous Stopped 03/15/24 2057)  insulin  aspart (novoLOG ) injection 10 Units (10 Units Subcutaneous Given 03/15/24 1955)      {Click here for ABCD2, HEART and other calculators REFRESH Note before signing:1}                              Medical Decision Making Amount and/or Complexity of Data Reviewed Labs: ordered.  Risk Prescription drug management.   Patient presents to the  ED for concern of hyperglycemia, this involves an extensive number of treatment options, and is a complaint that carries with it a high risk of complications and morbidity.  The differential diagnosis includes HHS, DKA, hyperglycemia d/t not having insulin    Co morbidities that complicate the patient evaluation  Insulin -dependent T2DM   Additional history obtained:  Additional history obtained from Nursing   External records from outside source obtained and reviewed including triage RN note   Lab Tests:  I Ordered, and personally interpreted labs.  The pertinent results include:   CBG 568 Anion gap 15 pH 7.394 HCO3 WNL    Cardiac Monitoring:  The patient was maintained on a cardiac monitor.  Medicines ordered and prescription drug management:  I ordered medication including insulin , IVF for hyperglycemia Reevaluation of the patient after these medicines showed that the patient improved I have reviewed the patients home medicines and have made adjustments as needed     Problem List / ED Course:  Hyperglycemia Polyuria No signs of DKA with no anion gap nor metabolic acidosis.  No ketones in urine No altered mentation Potassium 4.3   Reevaluation:  After the interventions noted above, I reevaluated the patient and found that they have :improved   Social Determinants of Health:  Has pcp f/u   Dispostion:  After consideration of the diagnostic results and the patients response to treatment, I feel that the patent would benefit from outpatient management with PCP f/u and insulin  management.   Discussed ED workup, disposition, return to ED precautions with patient who expresses understanding agrees with plan.  All questions answered to their satisfaction.  They are agreeable to plan.  Discharge instructions provided on paperwork  Final diagnoses:  Hyperglycemia  Type 2 diabetes mellitus without complication, with long-term current use of insulin  [facility])     ED Discharge Orders          Ordered    insulin  degludec (TRESIBA  FLEXTOUCH) 100 UNIT/ML FlexTouch Pen  Daily at bedtime        03/16/24 0005

## 2024-03-15 NOTE — ED Notes (Signed)
 Checked CBG 533, RN Tiiu informed

## 2024-03-16 MED ORDER — POTASSIUM CHLORIDE ER 20 MEQ PO TBCR: 10.0000 meq | EXTENDED_RELEASE_TABLET | Freq: Every day | ORAL | 0 refills | Status: AC

## 2024-03-16 MED ORDER — TRESIBA FLEXTOUCH 100 UNIT/ML ~~LOC~~ SOPN: 30.0000 [IU] | PEN_INJECTOR | Freq: Every day | SUBCUTANEOUS | 0 refills | Status: AC

## 2024-03-16 NOTE — Discharge Instructions (Signed)
 Thank for letting us  evaluate you today.  We have improved your sugar with insulin , fluids.  Please make sure to check your sugar before going to bed tonight.  Short course of insulin  until you are able to follow-up with your primary care provider and insurance to obtain refills  Return to emergency department if you experience altered mentation, seizure-like activity, significant worsening symptoms, sugar greater than 500 without improvement following insulin
